# Patient Record
Sex: Female | Born: 1960 | Race: White | Hispanic: No | Marital: Married | Smoking: Never smoker
Health system: Southern US, Community
[De-identification: ages and names within clinical notes are randomized; demographics above are authoritative.]

## PROBLEM LIST (undated history)

## (undated) DIAGNOSIS — E039 Hypothyroidism, unspecified: Secondary | ICD-10-CM

## (undated) DIAGNOSIS — Z87442 Personal history of urinary calculi: Secondary | ICD-10-CM

## (undated) DIAGNOSIS — D649 Anemia, unspecified: Secondary | ICD-10-CM

## (undated) DIAGNOSIS — T8859XA Other complications of anesthesia, initial encounter: Secondary | ICD-10-CM

## (undated) DIAGNOSIS — I499 Cardiac arrhythmia, unspecified: Secondary | ICD-10-CM

## (undated) DIAGNOSIS — I10 Essential (primary) hypertension: Secondary | ICD-10-CM

## (undated) HISTORY — PX: CHOLECYSTECTOMY: SHX55

## (undated) HISTORY — PX: ABDOMINAL HYSTERECTOMY: SHX81

## (undated) HISTORY — PX: DILATION AND CURETTAGE OF UTERUS: SHX78

## (undated) HISTORY — PX: APPENDECTOMY: SHX54

---

## 2004-11-14 ENCOUNTER — Ambulatory Visit: Payer: Self-pay | Admitting: Family Medicine

## 2005-12-30 ENCOUNTER — Ambulatory Visit: Payer: Self-pay | Admitting: Obstetrics and Gynecology

## 2008-06-24 ENCOUNTER — Ambulatory Visit: Payer: Self-pay | Admitting: Family Medicine

## 2008-06-29 ENCOUNTER — Ambulatory Visit: Payer: Self-pay | Admitting: Family Medicine

## 2008-07-01 ENCOUNTER — Inpatient Hospital Stay: Payer: Self-pay | Admitting: Surgery

## 2011-05-30 ENCOUNTER — Ambulatory Visit: Payer: Self-pay | Admitting: General Practice

## 2013-08-10 ENCOUNTER — Ambulatory Visit: Payer: Self-pay | Admitting: Internal Medicine

## 2015-04-05 DIAGNOSIS — G8929 Other chronic pain: Secondary | ICD-10-CM | POA: Insufficient documentation

## 2015-04-05 DIAGNOSIS — M542 Cervicalgia: Secondary | ICD-10-CM | POA: Insufficient documentation

## 2015-04-05 DIAGNOSIS — E559 Vitamin D deficiency, unspecified: Secondary | ICD-10-CM | POA: Insufficient documentation

## 2015-04-05 DIAGNOSIS — H9193 Unspecified hearing loss, bilateral: Secondary | ICD-10-CM | POA: Insufficient documentation

## 2015-04-05 DIAGNOSIS — E039 Hypothyroidism, unspecified: Secondary | ICD-10-CM | POA: Insufficient documentation

## 2016-02-07 ENCOUNTER — Other Ambulatory Visit: Payer: Self-pay | Admitting: Internal Medicine

## 2016-02-07 DIAGNOSIS — Z1239 Encounter for other screening for malignant neoplasm of breast: Secondary | ICD-10-CM

## 2016-02-27 ENCOUNTER — Ambulatory Visit: Payer: Self-pay

## 2016-03-08 ENCOUNTER — Ambulatory Visit
Admission: RE | Admit: 2016-03-08 | Discharge: 2016-03-08 | Disposition: A | Discharge status: Home / Self Care | Payer: Managed Care, Other (non HMO) | Source: Ambulatory Visit | Attending: Internal Medicine | Admitting: Internal Medicine

## 2016-03-08 DIAGNOSIS — Z1239 Encounter for other screening for malignant neoplasm of breast: Secondary | ICD-10-CM

## 2016-03-08 DIAGNOSIS — Z1231 Encounter for screening mammogram for malignant neoplasm of breast: Secondary | ICD-10-CM | POA: Insufficient documentation

## 2016-04-23 ENCOUNTER — Encounter: Payer: Self-pay | Admitting: Physician Assistant

## 2016-04-23 ENCOUNTER — Ambulatory Visit: Payer: Self-pay | Admitting: Physician Assistant

## 2016-04-23 VITALS — BP 120/70 | HR 83 | Temp 98.3°F

## 2016-04-23 DIAGNOSIS — B029 Zoster without complications: Secondary | ICD-10-CM

## 2016-04-23 MED ORDER — HYDROCODONE-ACETAMINOPHEN 5-325 MG PO TABS: 1.0000 | ORAL_TABLET | Freq: Four times a day (QID) | ORAL | Status: DC | PRN

## 2016-04-23 MED ORDER — METHYLPREDNISOLONE 4 MG PO TBPK: ORAL_TABLET | ORAL | Status: DC

## 2016-04-23 MED ORDER — FAMCICLOVIR 500 MG PO TABS: 500.0000 mg | ORAL_TABLET | Freq: Three times a day (TID) | ORAL | Status: DC

## 2016-04-23 NOTE — Progress Notes (Signed)
S: c/o pain under left breast and down shoulder, has red area, has happened before and red area turns into blisters after a day or 2, no fever/chills/injury, area is burning and painful  O: vitals wnl, nad, skin with reddened area under left axilla, area tender to palp, no vesicles noted, n/v intact  A: herpes zoster  P: famvir, medrol dose pack, vicodin 5/325 #20 nr

## 2016-08-22 DIAGNOSIS — E7800 Pure hypercholesterolemia, unspecified: Secondary | ICD-10-CM | POA: Insufficient documentation

## 2017-09-11 DIAGNOSIS — F4323 Adjustment disorder with mixed anxiety and depressed mood: Secondary | ICD-10-CM | POA: Insufficient documentation

## 2017-09-11 DIAGNOSIS — D7589 Other specified diseases of blood and blood-forming organs: Secondary | ICD-10-CM | POA: Insufficient documentation

## 2017-10-06 ENCOUNTER — Encounter: Payer: Self-pay | Admitting: Physician Assistant

## 2017-10-06 ENCOUNTER — Ambulatory Visit: Payer: Self-pay | Admitting: Physician Assistant

## 2017-10-06 VITALS — BP 118/75 | HR 77 | Temp 98.5°F | Resp 16

## 2017-10-06 DIAGNOSIS — H1013 Acute atopic conjunctivitis, bilateral: Secondary | ICD-10-CM

## 2017-10-06 DIAGNOSIS — J309 Allergic rhinitis, unspecified: Principal | ICD-10-CM

## 2017-10-06 MED ORDER — FEXOFENADINE-PSEUDOEPHED ER 60-120 MG PO TB12: 1.0000 | ORAL_TABLET | Freq: Two times a day (BID) | ORAL | 0 refills | Status: DC

## 2017-10-06 MED ORDER — FLUTICASONE PROPIONATE 50 MCG/ACT NA SUSP: 2.0000 | Freq: Every day | NASAL | 6 refills | Status: DC

## 2017-10-06 MED ORDER — NAPHAZOLINE HCL 0.1 % OP SOLN: 1.0000 [drp] | Freq: Four times a day (QID) | OPHTHALMIC | 0 refills | Status: DC | PRN

## 2017-10-06 NOTE — Progress Notes (Signed)
   Subjective:eye swelling    Patient ID: Kathryn Powers, female    DOB: 1961/08/29, [age] y.o.   MRN: [id number]  HPI Patient c/o bilateral eye burning and edema for 3 days. Denies discharge. States Photophobia today.  Also c/o nasal congestion. Denies fever/chill, or N/V/D.   Review of Systems Hypertension and Hypothroidism    Objective:   Physical Exam Photophobic with erythematous conjunctiva. Bilateral eyelid edema. Edematous bilateral nasal turbinates. Neck supp;e w/o adenopathy. Lungs CTA and Heart RRR.       Assessment & Plan:Allergic conjunctivia  Naphcon-A, Allergra-D, and Flonase as directed. Follow up one week if no improvement.

## 2017-10-09 ENCOUNTER — Telehealth: Payer: Self-pay | Admitting: Emergency Medicine

## 2017-10-09 NOTE — Telephone Encounter (Signed)
Is she saying her eye is worse or her cold is worse? If its her eye she needs drops, if its the cold sx she needs oral antibiotic

## 2017-10-09 NOTE — Telephone Encounter (Signed)
Patient called and expressed that her eye isn't getting any better.  Was told to call if no better and to get an antibiotic called in to [facility].

## 2017-12-04 ENCOUNTER — Emergency Department: Payer: Managed Care, Other (non HMO)

## 2017-12-04 ENCOUNTER — Ambulatory Visit: Payer: Self-pay

## 2017-12-04 ENCOUNTER — Encounter: Payer: Self-pay | Admitting: Emergency Medicine

## 2017-12-04 ENCOUNTER — Other Ambulatory Visit: Payer: Self-pay

## 2017-12-04 ENCOUNTER — Emergency Department
Admission: EM | Admit: 2017-12-04 | Discharge: 2017-12-04 | Disposition: A | Discharge status: Home / Self Care | Payer: Managed Care, Other (non HMO) | Attending: Emergency Medicine | Admitting: Emergency Medicine

## 2017-12-04 DIAGNOSIS — Y999 Unspecified external cause status: Secondary | ICD-10-CM | POA: Diagnosis not present

## 2017-12-04 DIAGNOSIS — R51 Headache: Secondary | ICD-10-CM | POA: Insufficient documentation

## 2017-12-04 DIAGNOSIS — Y939 Activity, unspecified: Secondary | ICD-10-CM | POA: Diagnosis not present

## 2017-12-04 DIAGNOSIS — S0083XA Contusion of other part of head, initial encounter: Secondary | ICD-10-CM | POA: Diagnosis not present

## 2017-12-04 DIAGNOSIS — Z79899 Other long term (current) drug therapy: Secondary | ICD-10-CM | POA: Insufficient documentation

## 2017-12-04 DIAGNOSIS — I10 Essential (primary) hypertension: Secondary | ICD-10-CM | POA: Diagnosis not present

## 2017-12-04 DIAGNOSIS — E039 Hypothyroidism, unspecified: Secondary | ICD-10-CM | POA: Diagnosis not present

## 2017-12-04 DIAGNOSIS — Y929 Unspecified place or not applicable: Secondary | ICD-10-CM | POA: Diagnosis not present

## 2017-12-04 DIAGNOSIS — W01190A Fall on same level from slipping, tripping and stumbling with subsequent striking against furniture, initial encounter: Secondary | ICD-10-CM | POA: Diagnosis not present

## 2017-12-04 DIAGNOSIS — W19XXXA Unspecified fall, initial encounter: Secondary | ICD-10-CM

## 2017-12-04 HISTORY — DX: Hypothyroidism, unspecified: E03.9

## 2017-12-04 HISTORY — DX: Essential (primary) hypertension: I10

## 2017-12-04 MED ORDER — ONDANSETRON 4 MG PO TBDP
4.0000 mg | ORAL_TABLET | Freq: Once | ORAL | Status: AC
  Administered 2017-12-04: 4 mg via ORAL
  Filled 2017-12-04: qty 1

## 2017-12-04 MED ORDER — CYCLOBENZAPRINE HCL 5 MG PO TABS: 5.0000 mg | ORAL_TABLET | Freq: Three times a day (TID) | ORAL | 0 refills | Status: AC | PRN

## 2017-12-04 MED ORDER — KETOROLAC TROMETHAMINE 30 MG/ML IJ SOLN
30.0000 mg | Freq: Once | INTRAMUSCULAR | Status: AC
  Administered 2017-12-04: 30 mg via INTRAMUSCULAR
  Filled 2017-12-04: qty 1

## 2017-12-04 MED ORDER — KETOROLAC TROMETHAMINE 10 MG PO TABS: 10.0000 mg | ORAL_TABLET | Freq: Four times a day (QID) | ORAL | 0 refills | Status: AC | PRN

## 2017-12-04 NOTE — ED Provider Notes (Signed)
[facility] Emergency Department Provider Note  ____________________________________________  Time seen: Approximately 3:08 PM  I have reviewed the triage vital signs and the nursing notes.   HISTORY  Chief Complaint Fall    HPI Kathryn Powers is a 56 y.o. female presents to the emergency department after a fall 2 days ago.  Patient reports that she experienced momentary loss of consciousness.  Patient reports blurry vision from right eye since incident.  She is also noticed significant right inferior orbit bruising as well as pain with right extraocular eye muscle movement.  Patient has been ambulating without difficulty and denies neck pain.  She has had nausea but no vomiting.  She denies weakness, radiculopathy or changes in sensation of the lower extremities.  She reports feeling "foggy".  Patient has been has noticed no changes in speech or mechanical movement.  No alleviating measures of been attempted.   Past Medical History:  Diagnosis Date  . Hypertension   . Hypothyroidism     There are no active problems to display for this patient.   History reviewed. No pertinent surgical history.  Prior to Admission medications   Medication Sig Start Date End Date Taking? Authorizing Provider  atenolol (TENORMIN) 50 MG tablet Take by mouth. 01/30/16   [provider]  cyclobenzaprine (FLEXERIL) 5 MG tablet Take 1 tablet (5 mg total) by mouth 3 (three) times daily as needed for up to 3 days for muscle spasms. 12/04/17 12/07/17  Orvil FeilWoods, Jaclyn M, PA-C  fexofenadine-pseudoephedrine (ALLEGRA-D) 60-120 MG 12 hr tablet Take 1 tablet by mouth 2 (two) times daily. 10/06/17   Joni ReiningSmith, Ronald K, PA-C  fluticasone (FLONASE) 50 MCG/ACT nasal spray Place 2 sprays into both nostrils daily. 10/06/17   Joni ReiningSmith, Ronald K, PA-C  ketorolac (TORADOL) 10 MG tablet Take 1 tablet (10 mg total) by mouth every 6 (six) hours as needed for up to 5 days. 12/04/17 12/09/17  Orvil FeilWoods,  Jaclyn M, PA-C  levothyroxine (SYNTHROID, LEVOTHROID) 100 MCG tablet Take by mouth. 01/30/16   [provider]  naphazoline (NAPHCON) 0.1 % ophthalmic solution Place 1 drop into both eyes 4 (four) times daily as needed for eye irritation. 10/06/17   Joni ReiningSmith, Ronald K, PA-C  pravastatin (PRAVACHOL) 20 MG tablet Take by mouth. 11/15/16 11/15/17  [provider]  Vitamin D, Ergocalciferol, (DRISDOL) 50000 units CAPS capsule TAKE ONE CAPSULE BY MOUTH ONCE WEEKLY 09/02/17   [provider]    Allergies Codeine  Family History  Problem Relation Age of Onset  . Breast cancer Neg Hx     Social History Social History   Tobacco Use  . Smoking status: Never Smoker  . Smokeless tobacco: Never Used  Substance Use Topics  . Alcohol use: Yes    Alcohol/week: 0.0 oz    Comment: occasional  . Drug use: No     Review of Systems  Constitutional: No fever/chills Eyes: Patient has right inferior orbit bruising.   ENT: No upper respiratory complaints. Cardiovascular: no chest pain. Respiratory: no cough. No SOB. Gastrointestinal: Patient has nausea. Musculoskeletal: Negative for musculoskeletal pain. Skin: Negative for rash, abrasions, lacerations, ecchymosis. Neurological: Patient has headache, no focal weakness or numbness.   ____________________________________________   PHYSICAL EXAM:  VITAL SIGNS: ED Triage Vitals  Enc Vitals Group     BP 12/04/17 1445 128/73     Pulse Rate 12/04/17 1445 83     Resp 12/04/17 1445 13     Temp 12/04/17 1445 98.6 F (37 C)  Temp Source 12/04/17 1445 Oral     SpO2 12/04/17 1445 97 %     Weight 12/04/17 1445 118 lb (53.5 kg)     Height 12/04/17 1445 4' 10.5" (1.486 m)     Head Circumference --      Peak Flow --      Pain Score 12/04/17 1455 8     Pain Loc --      Pain Edu? --      Excl. in GC? --      Constitutional: Alert and oriented. Well appearing and in no acute distress. Eyes: Conjunctivae are normal. PERRL.  EOMI. patient has pain elicited with right extraocular eye muscle testing.  Mild right nystagmus is also appreciated.  Patient has a stigmatism of the left eye at baseline. Head: Atraumatic. ENT:      Ears: TMs are pearly bilaterally without evidence of bloody effusion.      Nose: No congestion/rhinnorhea.      Mouth/Throat: Mucous membranes are moist.  Uvula is midline. Neck: No stridor.  No tenderness was elicited with palpation of the cervical spine. Cardiovascular: Normal rate, regular rhythm. Normal S1 and S2.  Good peripheral circulation. Respiratory: Normal respiratory effort without tachypnea or retractions. Lungs CTAB. Good air entry to the bases with no decreased or absent breath sounds. Gastrointestinal: Bowel sounds 4 quadrants. Soft and nontender to palpation. No guarding or rigidity. No palpable masses. No distention. No CVA tenderness. Musculoskeletal: Full range of motion to all extremities. No gross deformities appreciated. Neurologic:  Normal speech and language. No gross focal neurologic deficits are appreciated.  Skin:  Skin is warm, dry and intact. No rash noted. Psychiatric: Mood and affect are normal. Speech and behavior are normal. Patient exhibits appropriate insight and judgement.   ____________________________________________   LABS (all labs ordered are listed, but only abnormal results are displayed)  Labs Reviewed - No data to display ____________________________________________  EKG   ____________________________________________  RADIOLOGY Geraldo PitterI, Jaclyn M Woods, personally viewed and evaluated these images as part of my medical decision making, as well as reviewing the written report by the radiologist.    Ct Head Wo Contrast  Result Date: 12/04/2017 CLINICAL DATA:  Status post fall. States she tripped and hit a table or chair 2 days ago bruising noted to right eye. Continues to have headache. EXAM: CT HEAD WITHOUT CONTRAST CT MAXILLOFACIAL WITHOUT  CONTRAST TECHNIQUE: Multidetector CT imaging of the head and maxillofacial structures were performed using the standard protocol without intravenous contrast. Multiplanar CT image reconstructions of the maxillofacial structures were also generated. COMPARISON:  None. FINDINGS: CT HEAD FINDINGS Brain: No evidence of acute infarction, hemorrhage, hydrocephalus, extra-axial collection or mass lesion/mass effect. Vascular: No hyperdense vessel or unexpected calcification. Skull: No osseous abnormality. Sinuses/Orbits: Visualized paranasal sinuses are clear. Visualized mastoid sinuses are clear. Visualized orbits demonstrate no focal abnormality. Other: None CT MAXILLOFACIAL FINDINGS Osseous: No fracture or mandibular dislocation. No destructive process. Orbits: Negative. No traumatic or inflammatory finding. Sinuses: Clear. Soft tissues: Small right supraorbital hematoma. IMPRESSION: 1. No acute intracranial pathology. 2. No acute osseous injury the maxillofacial bones. 3. Right supraorbital hematoma. Electronically Signed   By: Elige KoHetal  Patel   On: 12/04/2017 16:17   Ct Maxillofacial Wo Contrast  Result Date: 12/04/2017 CLINICAL DATA:  Status post fall. States she tripped and hit a table or chair 2 days ago bruising noted to right eye. Continues to have headache. EXAM: CT HEAD WITHOUT CONTRAST CT MAXILLOFACIAL WITHOUT CONTRAST TECHNIQUE: Multidetector CT imaging of  the head and maxillofacial structures were performed using the standard protocol without intravenous contrast. Multiplanar CT image reconstructions of the maxillofacial structures were also generated. COMPARISON:  None. FINDINGS: CT HEAD FINDINGS Brain: No evidence of acute infarction, hemorrhage, hydrocephalus, extra-axial collection or mass lesion/mass effect. Vascular: No hyperdense vessel or unexpected calcification. Skull: No osseous abnormality. Sinuses/Orbits: Visualized paranasal sinuses are clear. Visualized mastoid sinuses are clear. Visualized  orbits demonstrate no focal abnormality. Other: None CT MAXILLOFACIAL FINDINGS Osseous: No fracture or mandibular dislocation. No destructive process. Orbits: Negative. No traumatic or inflammatory finding. Sinuses: Clear. Soft tissues: Small right supraorbital hematoma. IMPRESSION: 1. No acute intracranial pathology. 2. No acute osseous injury the maxillofacial bones. 3. Right supraorbital hematoma. Electronically Signed   By: Elige Ko   On: 12/04/2017 16:17    ____________________________________________    PROCEDURES  Procedure(s) performed:    Procedures    Medications  ondansetron (ZOFRAN-ODT) disintegrating tablet 4 mg (4 mg Oral Given 12/04/17 1514)  ketorolac (TORADOL) 30 MG/ML injection 30 mg (30 mg Intramuscular Given 12/04/17 1638)     ____________________________________________   INITIAL IMPRESSION / ASSESSMENT AND PLAN / ED COURSE  Pertinent labs & imaging results that were available during my care of the patient were reviewed by me and considered in my medical decision making (see chart for details).  Review of the [place] CSRS was performed in accordance of the [facility] prior to dispensing any controlled drugs.     Assessment and Plan: Patient presents to the emergency department with ecchymosis of the right inferior orbit, blurry vision and nausea.  CT head and CT maxillofacial were warranted.  Original differential diagnosis included blowout fracture versus extraocular eye muscle entrapment versus intracranial hemorrhage.  Workup conducted in the emergency department was reassuring and revealed no acute abnormality.  Neurologic exam was reassuring as well.  Patient was discharged with Toradol and Flexeril.  An injection of Toradol was given in the emergency department after CT scans were reviewed.  All patient questions were answered.   ____________________________________________  FINAL CLINICAL IMPRESSION(S) / ED DIAGNOSES  Final diagnoses:  Fall, initial  encounter  Contusion of face, initial encounter      NEW MEDICATIONS STARTED DURING THIS VISIT:  ED Discharge Orders        Ordered    ketorolac (TORADOL) 10 MG tablet  Every 6 hours PRN     12/04/17 1629    cyclobenzaprine (FLEXERIL) 5 MG tablet  3 times daily PRN     12/04/17 1629          This chart was dictated using voice recognition software/Dragon. Despite best efforts to proofread, errors can occur which can change the meaning. Any change was purely unintentional.    Orvil Feil, PA-C 12/04/17 1733    Dionne Bucy, MD 12/04/17 [phone]

## 2017-12-04 NOTE — ED Triage Notes (Signed)
Presents s/p fall  States she tripped and hit a table or chair 2 days ago  bruising noted to right eye   conts to have headache

## 2018-04-14 ENCOUNTER — Ambulatory Visit: Payer: Self-pay | Admitting: Physician Assistant

## 2018-04-14 VITALS — BP 142/93 | HR 75 | Resp 16 | Ht 60.0 in | Wt 130.0 lb

## 2018-04-14 DIAGNOSIS — Z008 Encounter for other general examination: Secondary | ICD-10-CM

## 2018-04-14 DIAGNOSIS — Z0189 Encounter for other specified special examinations: Principal | ICD-10-CM

## 2018-04-14 LAB — GLUCOSE, POCT (MANUAL RESULT ENTRY): POC GLUCOSE: 104 mg/dL — AB (ref 70–99)

## 2018-04-14 NOTE — Progress Notes (Signed)
   Subjective:    Patient ID: Kathryn Powers, female    DOB: 1960/12/24, 57 y.o.   MRN: [id number]  HPI    Review of Systems     Objective:   Physical Exam        Assessment & Plan:

## 2018-04-14 NOTE — Progress Notes (Signed)
Patient ID: Kathryn Powers, female   DOB: 09/23/1961, 57 y.o.   MRN: [id number]

## 2018-04-15 LAB — LIPID PANEL
CHOL/HDL RATIO: 2.7 ratio (ref 0.0–4.4)
CHOLESTEROL TOTAL: 262 mg/dL — AB (ref 100–199)
HDL: 98 mg/dL (ref >39)
LDL CALC: 130 mg/dL — AB (ref 0–99)
Triglycerides: 168 mg/dL — ABNORMAL HIGH (ref 0–149)
VLDL Cholesterol Cal: 34 mg/dL (ref 5–40)

## 2018-04-17 NOTE — Progress Notes (Signed)
Kathryn Powers, Will you call the patient and inform them that their lipid panel came back?  I am not sending her a message through Mychart about the results because I thought this would create confusion since she saw Lynnell Grain, PA on Tuesday instead of me.  This was ordered under my name since you were unable to order it under [name]'s name on Tuesday. The total cholesterol is elevated at 262, normal values are between 100 and 199.  The triglyceride level is elevated at 168, normal values are between 0 and 149.  The VLDL cholesterol is normal at 34, normal values are between 5 and 40. The HDL cholesterol ("good cholesterol") is normal at 98, normal values are above 39.  The LDL cholesterol ("bad cholesterol") is elevated at 130, normal values are below 99.  The cholesterol/HDL ratio is normal at 2.7, normal values are between 0 and 4.4 for women or 0 and 5 from men.  These abnormal values increase their risk for cardiovascular disease.  Please advise the patient to discuss this with their primary care provider at their next visit.  I saw that [name] documented that she already spoke with the patient regarding her elevated fasting blood sugar.

## 2018-05-04 ENCOUNTER — Other Ambulatory Visit: Payer: Self-pay

## 2018-05-04 ENCOUNTER — Emergency Department
Admission: EM | Admit: 2018-05-04 | Discharge: 2018-05-04 | Disposition: A | Discharge status: Home / Self Care | Payer: Managed Care, Other (non HMO) | Attending: Emergency Medicine | Admitting: Emergency Medicine

## 2018-05-04 ENCOUNTER — Emergency Department: Payer: Managed Care, Other (non HMO)

## 2018-05-04 ENCOUNTER — Encounter: Payer: Self-pay | Admitting: Emergency Medicine

## 2018-05-04 DIAGNOSIS — Y999 Unspecified external cause status: Secondary | ICD-10-CM | POA: Diagnosis not present

## 2018-05-04 DIAGNOSIS — Y939 Activity, unspecified: Secondary | ICD-10-CM | POA: Insufficient documentation

## 2018-05-04 DIAGNOSIS — S2231XA Fracture of one rib, right side, initial encounter for closed fracture: Secondary | ICD-10-CM | POA: Insufficient documentation

## 2018-05-04 DIAGNOSIS — Z79899 Other long term (current) drug therapy: Secondary | ICD-10-CM | POA: Diagnosis not present

## 2018-05-04 DIAGNOSIS — I10 Essential (primary) hypertension: Secondary | ICD-10-CM | POA: Diagnosis not present

## 2018-05-04 DIAGNOSIS — Y929 Unspecified place or not applicable: Secondary | ICD-10-CM | POA: Insufficient documentation

## 2018-05-04 DIAGNOSIS — E039 Hypothyroidism, unspecified: Secondary | ICD-10-CM | POA: Diagnosis not present

## 2018-05-04 DIAGNOSIS — S20301A Unspecified superficial injuries of right front wall of thorax, initial encounter: Secondary | ICD-10-CM | POA: Diagnosis present

## 2018-05-04 DIAGNOSIS — W19XXXA Unspecified fall, initial encounter: Secondary | ICD-10-CM | POA: Insufficient documentation

## 2018-05-04 MED ORDER — MELOXICAM 15 MG PO TABS: 15.0000 mg | ORAL_TABLET | Freq: Every day | ORAL | 0 refills | Status: DC

## 2018-05-04 MED ORDER — HYDROCODONE-ACETAMINOPHEN 5-325 MG PO TABS: 1.0000 | ORAL_TABLET | ORAL | 0 refills | Status: DC | PRN

## 2018-05-04 MED ORDER — MELOXICAM 7.5 MG PO TABS
15.0000 mg | ORAL_TABLET | Freq: Once | ORAL | Status: AC
  Administered 2018-05-04: 15 mg via ORAL
  Filled 2018-05-04: qty 2

## 2018-05-04 MED ORDER — OXYCODONE-ACETAMINOPHEN 5-325 MG PO TABS
1.0000 | ORAL_TABLET | Freq: Once | ORAL | Status: AC
  Administered 2018-05-04: 1 via ORAL
  Filled 2018-05-04: qty 1

## 2018-05-04 NOTE — ED Triage Notes (Signed)
Mechanical fall 3 days ago. Pain R ribs.

## 2018-05-04 NOTE — ED Provider Notes (Signed)
[facility] Emergency Department Provider Note  ____________________________________________  Time seen: Approximately 3:22 PM  I have reviewed the triage vital signs and the nursing notes.   HISTORY  Chief Complaint Fall    HPI Kathryn Powers is a 57 y.o. female who presents the emergency department complaining of right rib pain and shortness of breath.  Patient reports that she had an unprotected fall 3 days ago.  Patient reports that she slipped, falling directly on her right side.  Patient was unable to break her fall.  Patient did not hit her head or lose consciousness during this event.  Initially, patient had pain to both her right hip and her right rib cage.  Patient felt like it was "just a bruise" and did not seek medical care.  She reports that the hip pain has improved and she is ambulatory without difficulty.  However, her right rib pain has increased.  Today, patient bent over, felt a sharp pain to the right lateral ribs and then began feeling shortness of breath.  Patient does have a history of sternal fracture in the remote past.  Patient denies any headache, visual changes, substernal chest pain, abdominal pain, nausea or vomiting.  Past Medical History:  Diagnosis Date  . Hypertension   . Hypothyroidism     There are no active problems to display for this patient.   History reviewed. No pertinent surgical history.  Prior to Admission medications   Medication Sig Start Date End Date Taking? Authorizing Provider  atenolol (TENORMIN) 50 MG tablet Take by mouth. 01/30/16   [provider]  HYDROcodone-acetaminophen (NORCO/VICODIN) 5-325 MG tablet Take 1 tablet by mouth every 4 (four) hours as needed for moderate pain. 05/04/18   Uriah Philipson, Delorise Royals, PA-C  levothyroxine (SYNTHROID, LEVOTHROID) 100 MCG tablet Take by mouth. 01/30/16   [provider]  meloxicam (MOBIC) 15 MG tablet Take 1 tablet (15 mg total) by mouth daily.  05/04/18   Elise Gladden, Delorise Royals, PA-C  PARoxetine (PAXIL) 20 MG tablet TAKE ONE TABLET BY MOUTH DAILY 01/12/18   [provider]  pravastatin (PRAVACHOL) 20 MG tablet Take by mouth. 11/15/16 11/15/17  [provider]    Allergies Codeine  Family History  Problem Relation Age of Onset  . Breast cancer Neg Hx     Social History Social History   Tobacco Use  . Smoking status: Never Smoker  . Smokeless tobacco: Never Used  Substance Use Topics  . Alcohol use: Yes    Alcohol/week: 0.0 oz    Comment: occasional  . Drug use: No     Review of Systems  Constitutional: No fever/chills Eyes: No visual changes ENT: No upper respiratory complaints. Cardiovascular: no chest pain. Respiratory: no cough.  Positive SOB. Gastrointestinal: No abdominal pain.  No nausea, no vomiting.   Musculoskeletal: Positive for right lateral rib pain Skin: Negative for rash, abrasions, lacerations, ecchymosis. Neurological: Negative for headaches, focal weakness or numbness. 10-point ROS otherwise negative.  ____________________________________________   PHYSICAL EXAM:  VITAL SIGNS: ED Triage Vitals  Enc Vitals Group     BP 05/04/18 1512 128/82     Pulse Rate 05/04/18 1512 77     Resp 05/04/18 1512 18     Temp 05/04/18 1512 98.2 F (36.8 C)     Temp Source 05/04/18 1512 Oral     SpO2 05/04/18 1512 99 %     Weight 05/04/18 1514 120 lb (54.4 kg)     Height 05/04/18 1514 5' (1.524 m)  Head Circumference --      Peak Flow --      Pain Score 05/04/18 1514 9     Pain Loc --      Pain Edu? --      Excl. in GC? --      Constitutional: Alert and oriented. Well appearing and in no acute distress. Eyes: Conjunctivae are normal. PERRL. EOMI. Head: Atraumatic. Neck: No stridor.  No cervical spine tenderness to palpation  Cardiovascular: Normal rate, regular rhythm. Normal S1 and S2.  Good peripheral circulation. Respiratory: Normal respiratory effort without tachypnea or  retractions. Lungs CTAB. Good air entry to the bases with no decreased or absent breath sounds. Gastrointestinal: Bowel sounds 4 quadrants. Soft and nontender to palpation. No guarding or rigidity. No palpable masses. No distention. No CVA tenderness. Musculoskeletal: Full range of motion to all extremities. No gross deformities appreciated.  No appreciable abnormality to the external rib cage on inspection.  No paradoxical chest wall movement.  Patient is extremely tender to palpation over the right lateral and anterolateral rib cage.  Patient is most tender palpation of her ribs 7 through 12.  No palpable abnormality.  No crepitus.  No decreased or changes in underlying breath sounds from unaffected side. Neurologic:  Normal speech and language. No gross focal neurologic deficits are appreciated.  Skin:  Skin is warm, dry and intact. No rash noted. Psychiatric: Mood and affect are normal. Speech and behavior are normal. Patient exhibits appropriate insight and judgement.   ____________________________________________   LABS (all labs ordered are listed, but only abnormal results are displayed)  Labs Reviewed - No data to display ____________________________________________  EKG   ____________________________________________  RADIOLOGY Festus Barren Woodard Perrell, personally viewed and evaluated these images (plain radiographs) as part of my medical decision making, as well as reviewing the written report by the radiologist.  Dg Ribs Unilateral W/chest Right  Result Date: 05/04/2018 CLINICAL DATA:  Recent fall with right chest wall pain. EXAM: RIGHT RIBS AND CHEST - 3+ VIEW COMPARISON:  None. FINDINGS: Normal heart size. Normal mediastinal contour. No pneumothorax. No pleural effusion. Lungs appear clear, with no acute consolidative airspace disease and no pulmonary edema. The area of symptomatic concern as indicated by the patient in the lower right chest wall was denoted with a metallic  skin BB by the technologist. Acute nondisplaced anterior right seventh rib fracture. No additional fractures. No suspicious focal osseous lesions. IMPRESSION: 1. Acute nondisplaced anterior right seventh rib fracture. 2. No active cardiopulmonary disease.  No pneumothorax. Electronically Signed   By: Delbert Phenix M.D.   On: 05/04/2018 15:49    ____________________________________________    PROCEDURES  Procedure(s) performed:    Procedures    Medications  oxyCODONE-acetaminophen (PERCOCET/ROXICET) 5-325 MG per tablet 1 tablet (1 tablet Oral Given 05/04/18 1629)  meloxicam (MOBIC) tablet 15 mg (15 mg Oral Given 05/04/18 1629)     ____________________________________________   INITIAL IMPRESSION / ASSESSMENT AND PLAN / ED COURSE  Pertinent labs & imaging results that were available during my care of the patient were reviewed by me and considered in my medical decision making (see chart for details).  Review of the [place] CSRS was performed in accordance of the [facility] prior to dispensing any controlled drugs.     Patient's diagnosis is consistent with refracture.  Patient presents emergency department with pain to the right rib cage and shortness of breath.  On exam, no palpable findings, no changes in underlying breath sounds.  X-ray reveals seventh  rib fracture to the right side.  No pneumothorax or other cardiopulmonary abnormality identified on x-ray.. Patient will be discharged home with prescriptions for meloxicam and limited prescription of Vicodin for pain relief.. Patient is to follow up with primary care as needed or otherwise directed. Patient is given ED precautions to return to the ED for any worsening or new symptoms.     ____________________________________________  FINAL CLINICAL IMPRESSION(S) / ED DIAGNOSES  Final diagnoses:  Closed fracture of one rib of right side, initial encounter      NEW MEDICATIONS STARTED DURING THIS VISIT:  ED Discharge Orders         Ordered    HYDROcodone-acetaminophen (NORCO/VICODIN) 5-325 MG tablet  Every 4 hours PRN     05/04/18 1621    meloxicam (MOBIC) 15 MG tablet  Daily     05/04/18 1621          This chart was dictated using voice recognition software/Dragon. Despite best efforts to proofread, errors can occur which can change the meaning. Any change was purely unintentional.    Racheal Patches, PA-C 05/04/18 1639    Don Perking, [place], MD 05/07/18 [phone]

## 2018-07-30 ENCOUNTER — Ambulatory Visit: Payer: Self-pay | Admitting: Family Medicine

## 2018-07-30 VITALS — BP 123/83 | HR 70 | Temp 98.4°F | Resp 16

## 2018-07-30 DIAGNOSIS — M791 Myalgia, unspecified site: Secondary | ICD-10-CM

## 2018-07-30 DIAGNOSIS — R519 Headache, unspecified: Secondary | ICD-10-CM

## 2018-07-30 DIAGNOSIS — W57XXXA Bitten or stung by nonvenomous insect and other nonvenomous arthropods, initial encounter: Secondary | ICD-10-CM

## 2018-07-30 DIAGNOSIS — R5383 Other fatigue: Secondary | ICD-10-CM

## 2018-07-30 DIAGNOSIS — R51 Headache: Secondary | ICD-10-CM

## 2018-07-30 LAB — POCT INFLUENZA A/B
INFLUENZA A, POC: NEGATIVE
Influenza B, POC: NEGATIVE

## 2018-07-30 MED ORDER — DOXYCYCLINE HYCLATE 100 MG PO TABS: 100.0000 mg | ORAL_TABLET | Freq: Two times a day (BID) | ORAL | 0 refills | Status: AC

## 2018-07-30 NOTE — Progress Notes (Signed)
Subjective: Headache, body aches, tick bite  Kathryn Powers is a 57 y.o. female who presents for evaluation mild headache, body aches, fatigue, low-grade fever, sore throat, arthralgias (bilateral knees and elbows especially), and nasal congestion with green drainage for 6 days.   Patient reports sore throat and congestion have been improving but that the remainder of her symptoms have remained the same since the onset.  Reports feeling "under the weather" last week with fatigue being her predominant symptom but that she just though she was "run down".  Patient thought it might be an upper respiratory infection until she found a tick attached to her right posterior neck this morning while in the shower.  Patient describes the tick as black and "plump".  Unsure how long the tick has been present for or the type of tick.  Patient reports being outdoors multiple times in the last couple [place]  Patient denies any previous history of tickborne illness to her knowledge. Patient also endorses diarrhea that began last night, describes as mild, denies blood/mucus/foul odor/steatorrhea or any other abnormal characteristics.  Reports this is not totally abnormal for her, as she has irregular bowel patterns chronically.  Reports low-grade fever intermittently for the last 6 days, T-max 99.0. Denies any other lesions or rash except for a few small bumps under her right breast that she noticed yesterday that she describes as pruritic.  Describes as stable in appearance.  Denies pain or tenderness to palpation.  Denies drainage or bleeding. Patient has not had contacts with similar symptoms.  Denies recent travel.    Denies abdominal pain, neurologic or cardiac symptoms, irritability, nausea, vomiting, SOB, wheezing, cough, chest or back pain, ear pain, difficulty swallowing, confusion, anosmia/hyposmia, dental pain, facial pressure/unilateral facial pain, pain exacerbated by  bending over, severe symptoms, or initial improvement and then worsening of symptoms. Denies any other symptoms or complaints.  Treatment attempted at home: Ibuprofen, acetaminophen, and TheraFlu.  Review of Systems Pertinent items noted in HPI and remainder of comprehensive ROS otherwise negative.    Objective:   BP 123/83 (BP Location: Right Arm, Patient Position: Sitting, Cuff Size: Normal)   Pulse 70   Temp 98.4 F (36.9 C) (Oral)   Resp 16   SpO2 98%  General: alert, cooperative, appears stated age and no distress.  Nontoxic appearance. Head: Normocephalic, without obvious abnormality, atraumatic Eyes: conjunctivae/corneas clear. PERRL, EOM's intact.  Ears: normal TM's and external ear canals both ears Nose: Nares normal. Septum midline.  Mild erythema to nasal mucosa.  PND noted.  No sinus tenderness.   Throat: lips, mucosa, and tongue normal; teeth and gums normal Neck: no adenopathy and supple, symmetrical, trachea midline Back: symmetric, no curvature. No CVA tenderness. Lungs: clear to auscultation bilaterally Heart: regular rate and rhythm, S1, S2 normal, no murmur, click, rub or gallop Extremities: extremities normal, atraumatic, no cyanosis or edema Pulses: 2+ and symmetric Skin: Small, circular pink lesion measuring approximately 3 mm in diameter with a small central, superficial break in the skin measuring approximately 1 mm in diameter to right posterior neck at the hairline.  1 mm of surrounding erythema.  No central clearing or "bull's-eye".  No fluctuance.  No underlying induration.  No drainage or bleeding.  Tick does not appear to be present.  2 small pink papules measuring approximately 1 mm in diameter adjacent to where the tick was removed, without surrounding erythema, edema, warmth to touch or any other abnormal findings.  3 small, discrete, pink papules measuring approximately 1 mm diameter, widely spaced out from one another, underneath right breast.  No other  abnormalities on exam of this site. Mobility and turgor normal, no abnormal pigmentation, no evidence of bleeding, temperature normal and texture normal.. No petechia, purpura, erythema, edema, ecchymosis, or warmth to the touch.  No lesions to hands, feet, wrists, or ankles. No other lesions or rashes, or breaks in skin noted.  Lymph nodes: No lymphadenopathy noted.  Neurologic: Alert and oriented X 3, normal strength and tone. Normal symmetric reflexes. Normal coordination and gait   Assessment:   Tick exposure  Plan:   Tick exposure and symptoms possibly consistent with tickborne illness and/or acute upper respiratory infection.   CBC, CMP, [place] spotted fever IgG/IgM, and Lyme antibody test pending.  Discussed limitations of these tests with the patient. Discussed wound care at home.  Discussed OTC symptomatic treatments.  Prescribed doxycycline and advised patient to follow-up with her primary care provider within the next 2 days for reevaluation or return to our clinic if needed. Discussed side/adverse effects of doxycycline. Red flag symptoms and indications to seek medical care discussed.

## 2018-08-01 LAB — CBC WITH DIFFERENTIAL/PLATELET
BASOS ABS: 0 10*3/uL (ref 0.0–0.2)
Basos: 0 %
EOS (ABSOLUTE): 0.2 10*3/uL (ref 0.0–0.4)
Eos: 2 %
Hematocrit: 42.2 % (ref 34.0–46.6)
Hemoglobin: 13.8 g/dL (ref 11.1–15.9)
Immature Grans (Abs): 0 10*3/uL (ref 0.0–0.1)
Immature Granulocytes: 0 %
LYMPHS ABS: 1.6 10*3/uL (ref 0.7–3.1)
Lymphs: 24 %
MCH: 30.7 pg (ref 26.6–33.0)
MCHC: 32.7 g/dL (ref 31.5–35.7)
MCV: 94 fL (ref 79–97)
MONOCYTES: 8 %
MONOS ABS: 0.5 10*3/uL (ref 0.1–0.9)
NEUTROS PCT: 66 %
Neutrophils Absolute: 4.5 10*3/uL (ref 1.4–7.0)
Platelets: 267 10*3/uL (ref 150–450)
RBC: 4.49 x10E6/uL (ref 3.77–5.28)
RDW: 12.5 % (ref 12.3–15.4)
WBC: 6.9 10*3/uL (ref 3.4–10.8)

## 2018-08-01 LAB — COMPREHENSIVE METABOLIC PANEL
ALT: 37 IU/L — AB (ref 0–32)
AST: 28 IU/L (ref 0–40)
Albumin/Globulin Ratio: 1.9 (ref 1.2–2.2)
Albumin: 4.7 g/dL (ref 3.5–5.5)
Alkaline Phosphatase: 65 IU/L (ref 39–117)
BUN/Creatinine Ratio: 18 (ref 9–23)
BUN: 13 mg/dL (ref 6–24)
Bilirubin Total: 0.3 mg/dL (ref 0.0–1.2)
CO2: 22 mmol/L (ref 20–29)
CREATININE: 0.73 mg/dL (ref 0.57–1.00)
Calcium: 9.9 mg/dL (ref 8.7–10.2)
Chloride: 104 mmol/L (ref 96–106)
GFR calc Af Amer: 106 mL/min/{1.73_m2} (ref >59)
GFR calc non Af Amer: 92 mL/min/{1.73_m2} (ref >59)
GLUCOSE: 85 mg/dL (ref 65–99)
Globulin, Total: 2.5 g/dL (ref 1.5–4.5)
Potassium: 4.9 mmol/L (ref 3.5–5.2)
Sodium: 141 mmol/L (ref 134–144)
Total Protein: 7.2 g/dL (ref 6.0–8.5)

## 2018-08-01 LAB — ROCKY MTN SPOTTED FVR ABS PNL(IGG+IGM)
RMSF IGM: 0.74 {index} (ref 0.00–0.89)
RMSF IgG: NEGATIVE

## 2018-08-01 LAB — LYME AB/WESTERN BLOT REFLEX: Lyme IgG/IgM Ab: <0.91 {ISR} (ref 0.00–0.90)

## 2018-08-03 NOTE — Progress Notes (Signed)
Carollee HerterShannon, Will you call the patient and inform her that her lab results came back?  The lab work was negative [facility] and Lyme disease.  I do want her to take the full 7-day course of her doxycycline and still follow-up with her primary care provider as recommended.  The remainder of her tests were normal, with the exception of her ALT.  This was just slightly elevated at 37, normal values are between 0 and 32.  This test is a measure of liver function.  Please advise her to follow-up with her primary care provider regarding this.

## 2018-09-25 ENCOUNTER — Emergency Department: Payer: Managed Care, Other (non HMO)

## 2018-09-25 ENCOUNTER — Emergency Department
Admission: EM | Admit: 2018-09-25 | Discharge: 2018-09-25 | Disposition: A | Discharge status: Home / Self Care | Payer: Managed Care, Other (non HMO) | Attending: Emergency Medicine | Admitting: Emergency Medicine

## 2018-09-25 ENCOUNTER — Encounter: Payer: Self-pay | Admitting: Medical Oncology

## 2018-09-25 DIAGNOSIS — M7122 Synovial cyst of popliteal space [Baker], left knee: Secondary | ICD-10-CM | POA: Insufficient documentation

## 2018-09-25 DIAGNOSIS — M25562 Pain in left knee: Secondary | ICD-10-CM | POA: Diagnosis present

## 2018-09-25 DIAGNOSIS — R2242 Localized swelling, mass and lump, left lower limb: Secondary | ICD-10-CM | POA: Insufficient documentation

## 2018-09-25 DIAGNOSIS — Z79899 Other long term (current) drug therapy: Secondary | ICD-10-CM | POA: Insufficient documentation

## 2018-09-25 DIAGNOSIS — I10 Essential (primary) hypertension: Secondary | ICD-10-CM | POA: Diagnosis not present

## 2018-09-25 DIAGNOSIS — E039 Hypothyroidism, unspecified: Secondary | ICD-10-CM | POA: Diagnosis not present

## 2018-09-25 NOTE — ED Provider Notes (Signed)
[facility] Emergency Department Provider Note    ____________________________________________   I have reviewed the triage vital signs and the nursing notes.   HISTORY  Chief Complaint Leg Pain   History limited by: Not Limited   HPI Kathryn Powers is a 57 y.o. female who presents to the emergency department today because of left leg pain and concern for possible blood clot. The pain is located at her left knee.  She states is charted about 1 month ago when she started having pain to the inside of her knee.  She does not recall any trauma to the knee.  Does not recall any unusual activity prior to the pain starting.  Since that time however the pain has moved somewhat behind the knee to the outside.  She has noticed some associated swelling.  Patient states that the pain is worse with flatfooted shoes. She denies similar symptoms in the past however does state she has a history of an injury to her meniscus in that knee a number of years ago. 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    Per medical record review patient has a history of htn, hypothyroidism.   Past Medical History:  Diagnosis Date  . Hypertension   . Hypothyroidism     There are no active problems to display for this patient.   No past surgical history on file.  Prior to Admission medications   Medication Sig Start Date End Date Taking? Authorizing Provider  atenolol (TENORMIN) 50 MG tablet Take by mouth. 01/30/16   [provider]  levothyroxine (SYNTHROID, LEVOTHROID) 100 MCG tablet Take by mouth. 01/30/16   [provider]  meloxicam (MOBIC) 15 MG tablet Take 1 tablet (15 mg total) by mouth daily. 05/04/18   Cuthriell, Delorise Royals, PA-C  PARoxetine (PAXIL) 20 MG tablet TAKE ONE TABLET BY MOUTH DAILY 01/12/18   [provider]  pravastatin (PRAVACHOL) 20 MG tablet Take by mouth. 11/15/16 11/15/17  [provider]     Allergies Codeine  Family History  Problem Relation Age of Onset  . Breast cancer Neg Hx     Social History Social History   Tobacco Use  . Smoking status: Never Smoker  . Smokeless tobacco: Never Used  Substance Use Topics  . Alcohol use: Yes    Alcohol/week: 0.0 standard drinks    Comment: occasional  . Drug use: No    Review of Systems Constitutional: No fever/chills Eyes: No visual changes. ENT: No sore throat. Cardiovascular: Denies chest pain. Respiratory: Denies shortness of breath. Gastrointestinal: No abdominal pain.  No nausea, no vomiting.  No diarrhea.   Genitourinary: Negative for dysuria. Musculoskeletal: Positive for left leg pain. Skin: Negative for rash. Neurological: Negative for headaches, focal weakness or numbness.  ____________________________________________   PHYSICAL EXAM:  VITAL SIGNS: ED Triage Vitals [09/25/18 1324]  Enc Vitals Group     BP 108/85     Pulse Rate 81     Resp 16     Temp 98.6 F (37 C)     Temp Source Oral     SpO2 97 %     Weight 116 lb (52.6 kg)     Height 4\' 11"  (1.499 m)     Head Circumference      Peak Flow      Pain Score 7   Constitutional: Alert and oriented.  Eyes: Conjunctivae are normal.  ENT      Head: Normocephalic and atraumatic.  Nose: No congestion/rhinnorhea.      Mouth/Throat: Mucous membranes are moist.      Neck: No stridor. Hematological/Lymphatic/Immunilogical: No cervical lymphadenopathy. Cardiovascular: Normal rate, regular rhythm.  No murmurs, rubs, or gallops.  Respiratory: Normal respiratory effort without tachypnea nor retractions. Breath sounds are clear and equal bilaterally. No wheezes/rales/rhonchi. Gastrointestinal: Soft and non tender. No rebound. No guarding.  Genitourinary: Deferred Musculoskeletal: Left knee with very mild swelling, tender to palpation to the popliteal fossa. No warmth. No erythema.  Neurologic:  Normal speech and language. No gross focal  neurologic deficits are appreciated.  Skin:  Skin is warm, dry and intact. No rash noted. Psychiatric: Mood and affect are normal. Speech and behavior are normal. Patient exhibits appropriate insight and judgment.  ____________________________________________    LABS (pertinent positives/negatives)  None  ____________________________________________   EKG  None  ____________________________________________    RADIOLOGY  US venous lle No DVT. Baker's cyst.  ____________________________________________   PROCEDURES  Procedures  ____________________________________________   INITIAL IMPRESSION / ASSESSMENT AND PLAN / ED COURSE  Pertinent labs & imaging results that were available during my care of the patient were reviewed by me and considered in my medical decision making (see chart for details).   Patient presented to the emergency department today because of concerns for left knee pain and possible blood clot.  On exam patient's knee is very minimally swollen however there is no erythema or warmth.  Patient with some mild tenderness to range of motion.  Patient ultrasound did not show a blood clot.  Did however show a Baker's cyst.  I do think this likely is causing the patient's pain and swelling.  Do wonder if she reinjured her meniscus given that the pain is initially was on the inside of her knee if this is set up inflammation.  Discussed with patient importance of orthopedic follow-up.   ____________________________________________   FINAL CLINICAL IMPRESSION(S) / ED DIAGNOSES  Final diagnoses:  Acute pain of left knee  Baker's cyst of knee, left     Note: This dictation was prepared with Dragon dictation. Any transcriptional errors that result from this process are unintentional     Phineas Semen, MD 09/26/18 [phone]

## 2018-09-25 NOTE — ED Notes (Signed)
Pt states health person at work referred her over for a possible DVT on back of left leg. Upon lifting of her jeans, there was not any apparent redness or swelling, but she reports pain.

## 2018-09-25 NOTE — ED Notes (Signed)
Pt taken to ultrasound. VSS.

## 2018-09-25 NOTE — ED Triage Notes (Signed)
Pt reports for a few days she has been having pain/swelling/redness to back of leg behind knee. Pt was sent here from work for possible DVT.

## 2018-09-25 NOTE — Discharge Instructions (Addendum)
Please seek medical attention for any high fevers, chest pain, shortness of breath, change in behavior, persistent vomiting, bloody stool or any other new or concerning symptoms.  

## 2018-10-27 ENCOUNTER — Other Ambulatory Visit: Payer: Self-pay | Admitting: Internal Medicine

## 2018-10-27 DIAGNOSIS — R7401 Elevation of levels of liver transaminase levels: Secondary | ICD-10-CM

## 2018-10-27 DIAGNOSIS — Z0000 Encounter for general adult medical examination without abnormal findings: Secondary | ICD-10-CM

## 2018-10-27 DIAGNOSIS — G8929 Other chronic pain: Secondary | ICD-10-CM | POA: Insufficient documentation

## 2018-10-27 DIAGNOSIS — R740 Nonspecific elevation of levels of transaminase and lactic acid dehydrogenase [LDH]: Principal | ICD-10-CM

## 2018-12-24 ENCOUNTER — Ambulatory Visit: Payer: Self-pay | Admitting: Emergency Medicine

## 2018-12-24 ENCOUNTER — Emergency Department: Payer: Managed Care, Other (non HMO)

## 2018-12-24 ENCOUNTER — Encounter: Payer: Self-pay | Admitting: *Deleted

## 2018-12-24 ENCOUNTER — Other Ambulatory Visit: Payer: Self-pay

## 2018-12-24 ENCOUNTER — Emergency Department
Admission: EM | Admit: 2018-12-24 | Discharge: 2018-12-24 | Disposition: A | Discharge status: Home / Self Care | Payer: Managed Care, Other (non HMO) | Attending: Emergency Medicine | Admitting: Emergency Medicine

## 2018-12-24 VITALS — Temp 97.9°F | Resp 14

## 2018-12-24 DIAGNOSIS — R1032 Left lower quadrant pain: Secondary | ICD-10-CM | POA: Insufficient documentation

## 2018-12-24 DIAGNOSIS — I951 Orthostatic hypotension: Secondary | ICD-10-CM

## 2018-12-24 DIAGNOSIS — R197 Diarrhea, unspecified: Secondary | ICD-10-CM

## 2018-12-24 DIAGNOSIS — R42 Dizziness and giddiness: Secondary | ICD-10-CM

## 2018-12-24 DIAGNOSIS — I10 Essential (primary) hypertension: Secondary | ICD-10-CM | POA: Diagnosis not present

## 2018-12-24 LAB — CBC
HEMATOCRIT: 43.3 % (ref 36.0–46.0)
Hemoglobin: 14.3 g/dL (ref 12.0–15.0)
MCH: 31.8 pg (ref 26.0–34.0)
MCHC: 33 g/dL (ref 30.0–36.0)
MCV: 96.4 fL (ref 80.0–100.0)
Platelets: 228 10*3/uL (ref 150–400)
RBC: 4.49 MIL/uL (ref 3.87–5.11)
RDW: 12.5 % (ref 11.5–15.5)
WBC: 5.3 10*3/uL (ref 4.0–10.5)
nRBC: 0 % (ref 0.0–0.2)

## 2018-12-24 LAB — POCT URINALYSIS DIPSTICK
BILIRUBIN UA: NEGATIVE
Blood, UA: NEGATIVE
GLUCOSE UA: NEGATIVE
Ketones, UA: NEGATIVE
Nitrite, UA: NEGATIVE
Protein, UA: NEGATIVE
Spec Grav, UA: 1.02 (ref 1.010–1.025)
Urobilinogen, UA: 0.2 E.U./dL
pH, UA: 5.5 (ref 5.0–8.0)

## 2018-12-24 LAB — URINALYSIS, COMPLETE (UACMP) WITH MICROSCOPIC
BACTERIA UA: NONE SEEN
BILIRUBIN URINE: NEGATIVE
Glucose, UA: NEGATIVE mg/dL
Hgb urine dipstick: NEGATIVE
Ketones, ur: NEGATIVE mg/dL
LEUKOCYTES UA: NEGATIVE
Nitrite: NEGATIVE
Protein, ur: NEGATIVE mg/dL
SPECIFIC GRAVITY, URINE: 1.018 (ref 1.005–1.030)
pH: 6 (ref 5.0–8.0)

## 2018-12-24 LAB — COMPREHENSIVE METABOLIC PANEL
ALK PHOS: 62 U/L (ref 38–126)
ALT: 32 U/L (ref 0–44)
AST: 24 U/L (ref 15–41)
Albumin: 4.6 g/dL (ref 3.5–5.0)
Anion gap: 8 (ref 5–15)
BUN: 18 mg/dL (ref 6–20)
CHLORIDE: 105 mmol/L (ref 98–111)
CO2: 25 mmol/L (ref 22–32)
CREATININE: 0.72 mg/dL (ref 0.44–1.00)
Calcium: 9.7 mg/dL (ref 8.9–10.3)
GFR calc Af Amer: >60 mL/min (ref >60)
GFR calc non Af Amer: >60 mL/min (ref >60)
Glucose, Bld: 96 mg/dL (ref 70–99)
Potassium: 3.9 mmol/L (ref 3.5–5.1)
SODIUM: 138 mmol/L (ref 135–145)
Total Bilirubin: 0.9 mg/dL (ref 0.3–1.2)
Total Protein: 7.9 g/dL (ref 6.5–8.1)

## 2018-12-24 LAB — LIPASE, BLOOD: LIPASE: 32 U/L (ref 11–51)

## 2018-12-24 MED ORDER — IOHEXOL 300 MG/ML  SOLN
75.0000 mL | Freq: Once | INTRAMUSCULAR | Status: AC | PRN
  Administered 2018-12-24: 75 mL via INTRAVENOUS
  Filled 2018-12-24: qty 75

## 2018-12-24 MED ORDER — DIPHENOXYLATE-ATROPINE 2.5-0.025 MG PO TABS
2.0000 | ORAL_TABLET | Freq: Once | ORAL | Status: AC
  Administered 2018-12-24: 2 via ORAL
  Filled 2018-12-24: qty 2

## 2018-12-24 MED ORDER — SODIUM CHLORIDE 0.9 % IV BOLUS
1000.0000 mL | Freq: Once | INTRAVENOUS | Status: AC
  Administered 2018-12-24: 1000 mL via INTRAVENOUS

## 2018-12-24 MED ORDER — LOPERAMIDE HCL 2 MG PO TABS: 2.0000 mg | ORAL_TABLET | Freq: Four times a day (QID) | ORAL | 0 refills | Status: DC | PRN

## 2018-12-24 NOTE — Discharge Instructions (Addendum)
Please take a clear liquid diet for the next 12 to 24 hours, then advance to a bland diet as tolerated.  You may continue to take loperamide as prescribed.  If you are able to obtain a stool sample, please bring it with you to your primary care physician's office for evaluation for possible infection.  Return to the emergency department if you develop severe pain, lightheadedness or fainting, fever, or any other symptoms concerning to you.

## 2018-12-24 NOTE — Progress Notes (Signed)
Subjective: Patient presents with a 5-day history of feeling poorly primarily with cramps and diarrhea.  She has had up to 8 bowel movements per day.  They were initially dark in color and now have changed to a yellowish colored watery stool.  She has not seen any bright red blood.  She has had no nausea vomiting or fever with this illness.  She has had no recent travel has not been on antibiotics and no one else at home has been sick.  Her colonoscopy was done 4 years ago.  She has noted decrease in urination and has started to feel lightheaded when she gets up. Review of systems: Noncontributory except as relates to this illness. Objective: Today's Vitals   12/24/18 1152  Resp: 14  Temp: 97.9 F (36.6 C)  TempSrc: Oral  SpO2: 100%  BP lying 110/80 pulse 70   B/p standing 98/68 pulse 82 There is no height or weight on file to calculate BMI.  There is no scleral icterus. Neck supple. Chest clear. Heart regular rate and rhythm. Abdomen is not distended.  Bowel sounds are present.  There is tenderness present in the upper epigastric area as well as in the lower abdominal area without rebound. Assessment: Patient presents with 4 days of bloating, cramps, and diarrhea.  No recent travel or antibiotics.  Colonoscopy up-to-date.  She is lightheaded when she is up.  She is mildly orthostatic on testing with blood pressure 110/80 pulse 70 on lying and standing blood pressure 98/68 with pulse 82.  Her specific gravity was 1.020 I do think the patient would benefit from IV fluids and CBC with c-Met.  She did have an elevated liver function test earlier this year she has had her gallbladder out.  Hopefully a specimen can be obtained for a GI pathogen panel. Plan:. Call shuttle for transport. Send urine culture. ER to be called regarding referral.

## 2018-12-24 NOTE — ED Notes (Signed)
Pt can not provide stool sample at this time but is updated to alert nurses in case stool sample is needed.

## 2018-12-24 NOTE — ED Triage Notes (Signed)
Pt to ED reporting diarrhea since Monday. 6-8 episodes a day with nol fevers and no vomiting. abd cramps. PT reports the stool is yellowish in color and watery. No blood or tarry coloring. No recent antibiotic use.

## 2018-12-24 NOTE — ED Notes (Signed)
First Nurse Note: Patient sent over from Acute Care with report of dehydration.

## 2018-12-24 NOTE — ED Provider Notes (Signed)
[facility] Emergency Department Provider Note  ____________________________________________  Time seen: Approximately 5:05 PM  I have reviewed the triage vital signs and the nursing notes.   HISTORY  Chief Complaint Diarrhea    HPI Kathryn Powers is a 58 y.o. female history of HTN, hypothyroidism with recent normal thyroid testing, presenting with 4 days of diarrhea.  The patient reports that for the past 4 days, she has been having 8-10 episodes of loose or watery stool.  She reports that eating makes it worse.  She has some mild lower abdominal cramping with this.  She has not had any nausea or vomiting, fevers or chills.  No recent antibiotics, travel outside the [place], or known sick contacts.  She has not had any cough or cold symptoms.  She has tried Imodium, Kaopectate and Tums without any improvement.  Past Medical History:  Diagnosis Date  . Hypertension   . Hypothyroidism     There are no active problems to display for this patient.   History reviewed. No pertinent surgical history.  Current Outpatient Rx  . Order #: [id number] Class: Historical Med  . Order #: [id number] Class: Historical Med  . Order #: [id number] Class: Print  . Order #: [id number] Class: Historical Med  . Order #: [id number] Class: Historical Med    Allergies Codeine  Family History  Problem Relation Age of Onset  . Breast cancer Neg Hx     Social History Social History   Tobacco Use  . Smoking status: Never Smoker  . Smokeless tobacco: Never Used  Substance Use Topics  . Alcohol use: Yes    Alcohol/week: 0.0 standard drinks    Comment: occasional  . Drug use: No    Review of Systems Constitutional: No fever/chills.  No lightheadedness or syncope. Eyes: No visual changes. ENT: No sore throat. No congestion or rhinorrhea. Cardiovascular: Denies chest pain. Denies palpitations. Respiratory: Denies shortness of breath.  No cough. Gastrointestinal:  Positive mild abdominal pain.  No nausea, no vomiting.  As of nonbloody diarrhea.  No constipation. Genitourinary: Negative for dysuria.  No urinary frequency. Musculoskeletal: Negative for back pain. Skin: Negative for rash. Neurological: Negative for headaches. No focal numbness, tingling or weakness.     ____________________________________________   PHYSICAL EXAM:  VITAL SIGNS: ED Triage Vitals [12/24/18 1257]  Enc Vitals Group     BP 124/84     Pulse Rate 76     Resp 16     Temp 98.5 F (36.9 C)     Temp Source Oral     SpO2 100 %     Weight 120 lb (54.4 kg)     Height 4' 10.5" (1.486 m)     Head Circumference      Peak Flow      Pain Score 0     Pain Loc      Pain Edu?      Excl. in GC?     Constitutional: Alert and oriented. Well appearing and in no acute distress. Answers questions appropriately. Eyes: Conjunctivae are normal.  EOMI. No scleral icterus. Head: Atraumatic. Nose: No congestion/rhinnorhea. Mouth/Throat: Mucous membranes are moist.  Neck: No stridor.  Supple.   Cardiovascular: Normal rate, regular rhythm. No murmurs, rubs or gallops.  Respiratory: Normal respiratory effort.  No accessory muscle use or retractions. Lungs CTAB.  No wheezes, rales or ronchi. Gastrointestinal: Soft, and nondistended.  Mild tenderness to palpation in the left lower quadrant.  No guarding or rebound.  No peritoneal signs. Musculoskeletal: No LE  edema.  Neurologic:  A&Ox3.  Speech is clear.  Face and smile are symmetric.  EOMI.  Moves all extremities well. Skin:  Skin is warm, dry and intact. No rash noted. Psychiatric: Mood and affect are normal. Speech and behavior are normal.  Normal judgement  ____________________________________________   LABS (all labs ordered are listed, but only abnormal results are displayed)  Labs Reviewed  URINALYSIS, COMPLETE (UACMP) WITH MICROSCOPIC - Abnormal; Notable for the following components:      Result Value   Color, Urine  YELLOW (*)    APPearance CLEAR (*)    All other components within normal limits  C DIFFICILE QUICK SCREEN W PCR REFLEX  GASTROINTESTINAL PANEL BY PCR, STOOL (REPLACES STOOL CULTURE)  LIPASE, BLOOD  COMPREHENSIVE METABOLIC PANEL  CBC   ____________________________________________  EKG  Not indicated ____________________________________________  RADIOLOGY  Ct Abdomen Pelvis W Contrast  Result Date: 12/24/2018 CLINICAL DATA:  Abdominal pain.  Diarrhea. EXAM: CT ABDOMEN AND PELVIS WITH CONTRAST TECHNIQUE: Multidetector CT imaging of the abdomen and pelvis was performed using the standard protocol following bolus administration of intravenous contrast. CONTRAST:  5mL OMNIPAQUE IOHEXOL 300 MG/ML  SOLN COMPARISON:  Abdominal ultrasound 06/24/2008 FINDINGS: Lower chest: No acute abnormality.  Small hiatal hernia. Hepatobiliary: No focal liver abnormality is seen. Status post cholecystectomy. No biliary dilatation. Pancreas: Unremarkable. No pancreatic ductal dilatation or surrounding inflammatory changes. Spleen: Normal in size without focal abnormality. Adrenals/Urinary Tract: Adrenal glands are unremarkable. Kidneys are normal, without renal calculi, focal lesion, or hydronephrosis. Right basilar bladder diverticulum. Stomach/Bowel: Stomach is within normal limits. Post appendectomy. No evidence of bowel wall thickening, distention, or inflammatory changes. Scattered colonic diverticulosis. Vascular/Lymphatic: Minimal aortic atherosclerosis. No enlarged abdominal or pelvic lymph nodes. Reproductive: Status post hysterectomy. No adnexal masses. Other: No abdominal wall hernia or abnormality. No abdominopelvic ascites. Musculoskeletal: No acute or significant osseous findings. IMPRESSION: No evidence of acute abnormalities within the solid abdominal organs. Small hiatal hernia. No evidence of small-bowel obstruction or significant inflammatory changes. Scattered colonic diverticulosis. Electronically  Signed   By: Ted Mcalpine M.D.   On: 12/24/2018 17:56    ____________________________________________   PROCEDURES  Procedure(s) performed: None  Procedures  Critical Care performed: No ____________________________________________   INITIAL IMPRESSION / ASSESSMENT AND PLAN / ED COURSE  Pertinent labs & imaging results that were available during my care of the patient were reviewed by me and considered in my medical decision making (see chart for details).  58 y.o. history of HTN and thyroid dysfunction, presenting with 4 days of diarrhea.  + LLQ pain on exam.  Consider infectious etiology, versus diverticulitis.  Plan CT and symptomatic tx.  The pt was concerned about dehydration and while she has mildly dry mucous membranes, her vital signs are normal, her creatinine is normal, her specific gravity in the urine is normal, and she has good skin turgor.  We will give her intravenous fluids.  Plan reevaluation for final disposition.  ----------------------------------------- 6:13 PM on 12/24/2018 -----------------------------------------  The patient was unable to provide a stool sample while she was here.  I will treat her with 2 tablets of Lomotil, and then have her continue her outpatient loperamide use.  Her CT scan does not show any acute process, including diverticulitis.  At this time, the patient is safe for discharge home.  Return precautions as well as follow-up instructions were discussed. ____________________________________________  FINAL CLINICAL IMPRESSION(S) / ED DIAGNOSES  Final diagnoses:  Diarrhea, unspecified type  Left lower quadrant abdominal pain  NEW MEDICATIONS STARTED DURING THIS VISIT:  New Prescriptions   LOPERAMIDE (IMODIUM A-D) 2 MG TABLET    Take 1 tablet (2 mg total) by mouth 4 (four) times daily as needed for diarrhea or loose stools.      Rockne MenghiniNorman, Anne-Caroline, MD 12/24/18 [phone]

## 2018-12-24 NOTE — ED Notes (Signed)
Pt states she feels weak.

## 2018-12-24 NOTE — ED Notes (Signed)
Patient transported to CT 

## 2018-12-24 NOTE — Patient Instructions (Signed)
Patient sent to the emergency room by shuttle for evaluation

## 2018-12-25 LAB — GASTROINTESTINAL PANEL BY PCR, STOOL (REPLACES STOOL CULTURE)

## 2018-12-26 LAB — URINE CULTURE

## 2018-12-28 ENCOUNTER — Telehealth: Payer: Self-pay

## 2018-12-28 NOTE — Telephone Encounter (Signed)
Contacted patient about her negative urine culture results. The patient is feeling much better since her visit. She is following up with her PCP per her last visit with [place].

## 2019-09-07 ENCOUNTER — Encounter: Payer: Self-pay | Admitting: Adult Health

## 2019-09-07 ENCOUNTER — Ambulatory Visit: Payer: Managed Care, Other (non HMO) | Admitting: Adult Health

## 2019-09-07 ENCOUNTER — Other Ambulatory Visit: Payer: Self-pay

## 2019-09-07 VITALS — BP 116/60 | HR 73 | Temp 98.1°F | Resp 16 | Ht 60.0 in | Wt 143.0 lb

## 2019-09-07 DIAGNOSIS — Z0189 Encounter for other specified special examinations: Secondary | ICD-10-CM | POA: Diagnosis not present

## 2019-09-07 DIAGNOSIS — Z008 Encounter for other general examination: Secondary | ICD-10-CM | POA: Diagnosis not present

## 2019-09-07 NOTE — Progress Notes (Signed)
[place] DOB: 58 y.o. MRN: [id number]  Subjective:  Here for Biometric Screen/brief exam Patient is a 58 year old female in no acute distress who comes the clinic for a biometric brief exam and biometric screening for her employer.  She denies any ongoing health complaints at this time.  She has a primary care provider Glendon Axe, MD who she sees regularly.   Patient  denies any fever, body aches,chills, rash, chest pain, shortness of breath, nausea, vomiting, or diarrhea.   Patient Active Problem List   Diagnosis Date Noted  . Chronic pain of left knee 10/27/2018  . Adjustment disorder with mixed anxiety and depressed mood 09/11/2017  . Increased MCV 09/11/2017  . Pure hypercholesterolemia 08/22/2016  . Acquired hypothyroidism 04/05/2015  . Bilateral hearing loss 04/05/2015  . Chronic neck pain 04/05/2015  . Vitamin D deficiency 04/05/2015    Current Outpatient Medications:  .  cyclobenzaprine (FLEXERIL) 5 MG tablet, Take 1-2 tablets as needed for Muscle spasm, Disp: , Rfl:  .  dicyclomine (BENTYL) 20 MG tablet, TAKE ONE TABLET BY MOUTH EVERY 6 HOURS AS NEEDED FOR UP TO 7 DAYS, Disp: , Rfl:  .  atenolol (TENORMIN) 50 MG tablet, Take by mouth., Disp: , Rfl:  .  levothyroxine (SYNTHROID, LEVOTHROID) 100 MCG tablet, Take by mouth., Disp: , Rfl:  .  loperamide (IMODIUM A-D) 2 MG tablet, Take 1 tablet (2 mg total) by mouth 4 (four) times daily as needed for diarrhea or loose stools., Disp: 12 tablet, Rfl: 0 .  Multiple Vitamin (MULTI-VITAMIN) tablet, Take by mouth., Disp: , Rfl:  .  pantoprazole (PROTONIX) 40 MG tablet, , Disp: , Rfl:  .  PARoxetine (PAXIL) 20 MG tablet, TAKE ONE TABLET BY MOUTH DAILY, Disp: , Rfl:  .  pravastatin (PRAVACHOL) 20 MG tablet, Take by mouth., Disp: , Rfl:   Objective: Blood pressure 116/60, pulse 73, temperature 98.1 F (36.7 C), temperature source Temporal, resp. rate 16, height 5'  (1.524 m), weight 143 lb (64.9 kg), SpO2 98 %. NAD, well-developed well-nourished HEENT: Within normal limits Neck: Normal, supple, no cervical lymphadenopathy Heart: Regular rate and rhythm Lungs: Clear to auscultation Patient is alert and oriented and responsive to questions Engages in eye contact with provider. Speaks in full sentences without any pauses without any shortness of breath or distress.  Patient moves on and off of exam table and in room without difficulty. Gait is normal in hall and in room. Patient is oriented to person place time and situation. Patient answers questions appropriately and engages in conversation.   Assessment: Biometric screen    ICD-10-CM   1. Encounter for other general examination- not a full annual physical brief biometric exam and screening   Z00.8 Glucose, random    Lipid Panel With LDL/HDL Ratio  2. Encounter for biometric screening  Z01.89 Glucose, random    Lipid Panel With LDL/HDL Ratio    Plan: Keep your regular scheduled appointment with your primary care provider in the next 2 weeks. Fasting glucose and lipids. Discussed with patient that today's visit here is a limited biometric screening visit (not a comprehensive exam or management of any chronic problems) Discussed some health issues, including healthy eating habits and exercise. Encouraged to follow-up with PCP for annual comprehensive preventive and wellness care (and if applicable, any chronic issues). Questions invited and answered.  I will have the office call you on your glucose and cholesterol results when they return if you have not  heard within 1 week please call the office.  This biometric physical is a brief physical and the only labs done are glucose and your lipid panel(cholesterol) and is  not a substitute for seeing a primary care provider for a complete annual physical. Please see a primary care physician for routine health maintenance, labs and full physical at least  yearly and follow up as recommended by your provider. Provider also recommends if you do not have a primary care provider for patient to establish care as soon as possible .Patient may chose provider of choice. Also gave the [place]  PHYSICIAN/PROVIDER  REFERRAL LINE at [phone] or web site at [email or web address] to help assist with finding a primary care doctor.  Patient verbalizes understanding that his office is acute care only and not a substitute for a primary care or for the management of chronic conditions.

## 2019-09-07 NOTE — Patient Instructions (Signed)
  I will have the office call you on your glucose and cholesterol results when they return if you have not heard within 1 week please call the office.  This biometric physical is a brief physical and the only labs done are glucose and your lipid panel(cholesterol) and is  not a substitute for seeing a primary care provider for a complete annual physical. Please see a primary care physician for routine health maintenance, labs and full physical at least yearly and follow up as recommended by your provider. Provider also recommends if you do not have a primary care provider for patient to establish care as soon as possible .Patient may chose provider of choice. Also gave the [place]  PHYSICIAN/PROVIDER  REFERRAL LINE at [phone]- [phone] or web site at [email or web address] to help assist with finding a primary care doctor.  Patient verbalizes understanding that his office is acute care only and not a substitute for a primary care or for the management of chronic conditions.    Health Maintenance, Female Adopting a healthy lifestyle and getting preventive care are important in promoting health and wellness. Ask your health care provider about:  The right schedule for you to have regular tests and exams.  Things you can do on your own to prevent diseases and keep yourself healthy. What should I know about diet, weight, and exercise? Eat a healthy diet   Eat a diet that includes plenty of vegetables, fruits, low-fat dairy products, and lean protein.  Do not eat a lot of foods that are high in solid fats, added sugars, or sodium. Maintain a healthy weight Body mass index (BMI) is used to identify weight problems. It estimates body fat based on height and weight. Your health care provider can help determine your BMI and help you achieve or maintain a healthy weight. Get regular exercise Get regular exercise. This is one of the most important things you can do for your health. Most adults should:   Exercise for at least 150 minutes each week. The exercise should increase your heart rate and make you sweat (moderate-intensity exercise).  Do strengthening exercises at least twice a week. This is in addition to the moderate-intensity exercise.  Spend less time sitting. Even light physical activity can be beneficial. Watch cholesterol and blood lipids Have your blood tested for lipids and cholesterol at 57 years of age, then have this test every 5 years. Have your cholesterol levels checked more often if:  Your lipid or cholesterol levels are high.  You are older than 57 years of age.  You are at high risk for heart disease. What should I know about cancer screening? Depending on your health history and family history, you may need to have cancer screening at various ages. This may include screening for:  Breast cancer.  Cervical cancer.  Colorectal cancer.  Skin cancer.  Lung cancer. What should I know about heart disease, diabetes, and high blood pressure? Blood pressure and heart disease  High blood pressure causes heart disease and increases the risk of stroke. This is more likely to develop in people who have high blood pressure readings, are of African descent, or are overweight.  Have your blood pressure checked: ? Every 3-5 years if you are 18-39 years of age. ? Every year if you are 40 years old or older. Diabetes Have regular diabetes screenings. This checks your fasting blood sugar level. Have the screening done:  Once every three years after age 40 if you are   at a normal weight and have a low risk for diabetes.  More often and at a younger age if you are overweight or have a high risk for diabetes. What should I know about preventing infection? Hepatitis B If you have a higher risk for hepatitis B, you should be screened for this virus. Talk with your health care provider to find out if you are at risk for hepatitis B infection. Hepatitis C Testing is  recommended for:  Everyone born from 1945 through 1965.  Anyone with known risk factors for hepatitis C. Sexually transmitted infections (STIs)  Get screened for STIs, including gonorrhea and chlamydia, if: ? You are sexually active and are younger than 57 years of age. ? You are older than 57 years of age and your health care provider tells you that you are at risk for this type of infection. ? Your sexual activity has changed since you were last screened, and you are at increased risk for chlamydia or gonorrhea. Ask your health care provider if you are at risk.  Ask your health care provider about whether you are at high risk for HIV. Your health care provider may recommend a prescription medicine to help prevent HIV infection. If you choose to take medicine to prevent HIV, you should first get tested for HIV. You should then be tested every 3 months for as long as you are taking the medicine. Pregnancy  If you are about to stop having your period (premenopausal) and you may become pregnant, seek counseling before you get pregnant.  Take 400 to 800 micrograms (mcg) of folic acid every day if you become pregnant.  Ask for birth control (contraception) if you want to prevent pregnancy. Osteoporosis and menopause Osteoporosis is a disease in which the bones lose minerals and strength with aging. This can result in bone fractures. If you are 65 years old or older, or if you are at risk for osteoporosis and fractures, ask your health care provider if you should:  Be screened for bone loss.  Take a calcium or vitamin D supplement to lower your risk of fractures.  Be given hormone replacement therapy (HRT) to treat symptoms of menopause. Follow these instructions at home: Lifestyle  Do not use any products that contain nicotine or tobacco, such as cigarettes, e-cigarettes, and chewing tobacco. If you need help quitting, ask your health care provider.  Do not use street drugs.  Do not  share needles.  Ask your health care provider for help if you need support or information about quitting drugs. Alcohol use  Do not drink alcohol if: ? Your health care provider tells you not to drink. ? You are pregnant, may be pregnant, or are planning to become pregnant.  If you drink alcohol: ? Limit how much you use to 0-1 drink a day. ? Limit intake if you are breastfeeding.  Be aware of how much alcohol is in your drink. In the [place] one drink equals one 12 oz bottle of beer (355 mL), one 5 oz glass of wine (148 mL), or one 1 oz glass of hard liquor (44 mL). General instructions  Schedule regular health, dental, and eye exams.  Stay current with your vaccines.  Tell your health care provider if: ? You often feel depressed. ? You have ever been abused or do not feel safe at home. Summary  Adopting a healthy lifestyle and getting preventive care are important in promoting health and wellness.  Follow your health care provider's instructions about   healthy diet, exercising, and getting tested or screened for diseases.  Follow your health care provider's instructions on monitoring your cholesterol and blood pressure. This information is not intended to replace advice given to you by your health care provider. Make sure you discuss any questions you have with your health care provider. Document Released: 06/17/2011 Document Revised: 11/25/2018 Document Reviewed: 11/25/2018 Elsevier Patient Education  2020 Elsevier Inc.  

## 2019-09-08 ENCOUNTER — Encounter: Payer: Self-pay | Admitting: Adult Health

## 2019-09-08 LAB — LIPID PANEL WITH LDL/HDL RATIO
Cholesterol, Total: 257 mg/dL — ABNORMAL HIGH (ref 100–199)
HDL: 72 mg/dL (ref >39)
LDL Chol Calc (NIH): 124 mg/dL — ABNORMAL HIGH (ref 0–99)
LDL/HDL Ratio: 1.7 ratio (ref 0.0–3.2)
Triglycerides: 350 mg/dL — ABNORMAL HIGH (ref 0–149)
VLDL Cholesterol Cal: 61 mg/dL — ABNORMAL HIGH (ref 5–40)

## 2019-09-08 LAB — GLUCOSE, RANDOM: Glucose: 98 mg/dL (ref 65–99)

## 2020-03-29 ENCOUNTER — Other Ambulatory Visit: Payer: Self-pay | Admitting: Internal Medicine

## 2020-03-29 DIAGNOSIS — R109 Unspecified abdominal pain: Secondary | ICD-10-CM

## 2020-03-30 ENCOUNTER — Ambulatory Visit
Admission: RE | Admit: 2020-03-30 | Discharge: 2020-03-30 | Disposition: A | Discharge status: Home / Self Care | Payer: Managed Care, Other (non HMO) | Source: Ambulatory Visit | Attending: Internal Medicine | Admitting: Internal Medicine

## 2020-03-30 ENCOUNTER — Other Ambulatory Visit: Payer: Self-pay

## 2020-03-30 DIAGNOSIS — R109 Unspecified abdominal pain: Secondary | ICD-10-CM | POA: Insufficient documentation

## 2020-03-30 MED ORDER — IOHEXOL 300 MG/ML  SOLN
100.0000 mL | Freq: Once | INTRAMUSCULAR | Status: AC | PRN
  Administered 2020-03-30: 100 mL via INTRAVENOUS

## 2020-04-27 ENCOUNTER — Other Ambulatory Visit: Payer: Self-pay | Admitting: Obstetrics and Gynecology

## 2020-04-27 DIAGNOSIS — Z1231 Encounter for screening mammogram for malignant neoplasm of breast: Secondary | ICD-10-CM

## 2020-05-31 ENCOUNTER — Other Ambulatory Visit: Payer: Self-pay

## 2020-05-31 ENCOUNTER — Encounter: Payer: Self-pay | Admitting: Nurse Practitioner

## 2020-05-31 ENCOUNTER — Ambulatory Visit: Payer: Managed Care, Other (non HMO) | Admitting: Nurse Practitioner

## 2020-05-31 VITALS — BP 122/78 | HR 86 | Resp 16 | Ht 59.0 in | Wt 141.0 lb

## 2020-05-31 DIAGNOSIS — Z008 Encounter for other general examination: Secondary | ICD-10-CM

## 2020-05-31 NOTE — Progress Notes (Signed)
  Subjective:     Patient ID: Kathryn Powers, female   DOB: 05-15-1961, 59 y.o.   MRN: [id number]  HPI Kathryn Powers is a 59 y.o. female who presents to the [facility] for her annual biometric screening exam. 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 It is busy but she enjoys her work. She reports having a little scratchy throat today but feels it is due to her allergies. Employee is on a low sodium diet for herself and her husband (who has had a stroke).   PMH: HTN, hypothyroidism, anxiety/depression, hearing loss, vit. D deficiency Surg: None SH: denies use of tobacco, ETOH or drugs.  Immunizations: UTD,  has had both doses of Covid vaccine,  Meds: List reviewed and updated Allergies: Codeine  Review of Systems  HENT: Positive for sore throat (scratchy throat).   All other systems reviewed and are negative.     Objective: BP 122/78   Pulse 86   Resp 16   Ht 4\' 11"  (1.499 m)   Wt 141 lb (64 kg)   BMI 28.48 kg/m     Physical Exam Constitutional:      General: She is not in acute distress.    Appearance: Normal appearance.  HENT:     Head: Normocephalic.     Right Ear: Tympanic membrane normal.     Left Ear: Tympanic membrane normal.     Nose: Nose normal.     Mouth/Throat:     Mouth: Mucous membranes are moist.     Pharynx: Oropharynx is clear.  Eyes:     Conjunctiva/sclera: Conjunctivae normal.  Neck:     Thyroid: No thyroid mass or thyroid tenderness.     Vascular: Normal carotid pulses. No carotid bruit or JVD.     Trachea: Trachea normal.  Cardiovascular:     Rate and Rhythm: Normal rate and regular rhythm.  Pulmonary:     Effort: Pulmonary effort is normal.     Breath sounds: Normal breath sounds and air entry.  Abdominal:     Tenderness: There is no abdominal tenderness.  Musculoskeletal:     Cervical back: Normal range of motion and neck supple.  Lymphadenopathy:     Cervical: No cervical adenopathy.  Neurological:     Mental Status:  She is alert.        Assessment:    59 y.o. female here for annual biometric screening exam. Limited physical exam is normal.     Plan:    Discussed with the patient diet and exercise written and verbal instructions given. Employee given opportunity to ask questions. All questions answered and employee voices understanding. Plan to return in one year or sooner as needed.

## 2020-05-31 NOTE — Patient Instructions (Signed)
Health Maintenance, Female Adopting a healthy lifestyle and getting preventive care are important in promoting health and wellness. Ask your health care provider about:  The right schedule for you to have regular tests and exams.  Things you can do on your own to prevent diseases and keep yourself healthy. What should I know about diet, weight, and exercise? Eat a healthy diet   Eat a diet that includes plenty of vegetables, fruits, low-fat dairy products, and lean protein.  Do not eat a lot of foods that are high in solid fats, added sugars, or sodium. Maintain a healthy weight Body mass index (BMI) is used to identify weight problems. It estimates body fat based on height and weight. Your health care provider can help determine your BMI and help you achieve or maintain a healthy weight. Get regular exercise Get regular exercise. This is one of the most important things you can do for your health. Most adults should:  Exercise for at least 150 minutes each week. The exercise should increase your heart rate and make you sweat (moderate-intensity exercise).  Do strengthening exercises at least twice a week. This is in addition to the moderate-intensity exercise.  Spend less time sitting. Even light physical activity can be beneficial. Watch cholesterol and blood lipids Have your blood tested for lipids and cholesterol at 58 years of age, then have this test every 5 years. Have your cholesterol levels checked more often if:  Your lipid or cholesterol levels are high.  You are older than 58 years of age.  You are at high risk for heart disease. What should I know about cancer screening? Depending on your health history and family history, you may need to have cancer screening at various ages. This may include screening for:  Breast cancer.  Cervical cancer.  Colorectal cancer.  Skin cancer.  Lung cancer. What should I know about heart disease, diabetes, and high blood  pressure? Blood pressure and heart disease  High blood pressure causes heart disease and increases the risk of stroke. This is more likely to develop in people who have high blood pressure readings, are of African descent, or are overweight.  Have your blood pressure checked: ? Every 3-5 years if you are 18-39 years of age. ? Every year if you are 40 years old or older. Diabetes Have regular diabetes screenings. This checks your fasting blood sugar level. Have the screening done:  Once every three years after age 40 if you are at a normal weight and have a low risk for diabetes.  More often and at a younger age if you are overweight or have a high risk for diabetes. What should I know about preventing infection? Hepatitis B If you have a higher risk for hepatitis B, you should be screened for this virus. Talk with your health care provider to find out if you are at risk for hepatitis B infection. Hepatitis C Testing is recommended for:  Everyone born from 1945 through 1965.  Anyone with known risk factors for hepatitis C. Sexually transmitted infections (STIs)  Get screened for STIs, including gonorrhea and chlamydia, if: ? You are sexually active and are younger than 58 years of age. ? You are older than 58 years of age and your health care provider tells you that you are at risk for this type of infection. ? Your sexual activity has changed since you were last screened, and you are at increased risk for chlamydia or gonorrhea. Ask your health care provider if   you are at risk.  Ask your health care provider about whether you are at high risk for HIV. Your health care provider may recommend a prescription medicine to help prevent HIV infection. If you choose to take medicine to prevent HIV, you should first get tested for HIV. You should then be tested every 3 months for as long as you are taking the medicine. Pregnancy  If you are about to stop having your period (premenopausal) and  you may become pregnant, seek counseling before you get pregnant.  Take 400 to 800 micrograms (mcg) of folic acid every day if you become pregnant.  Ask for birth control (contraception) if you want to prevent pregnancy. Osteoporosis and menopause Osteoporosis is a disease in which the bones lose minerals and strength with aging. This can result in bone fractures. If you are 65 years old or older, or if you are at risk for osteoporosis and fractures, ask your health care provider if you should:  Be screened for bone loss.  Take a calcium or vitamin D supplement to lower your risk of fractures.  Be given hormone replacement therapy (HRT) to treat symptoms of menopause. Follow these instructions at home: Lifestyle  Do not use any products that contain nicotine or tobacco, such as cigarettes, e-cigarettes, and chewing tobacco. If you need help quitting, ask your health care provider.  Do not use street drugs.  Do not share needles.  Ask your health care provider for help if you need support or information about quitting drugs. Alcohol use  Do not drink alcohol if: ? Your health care provider tells you not to drink. ? You are pregnant, may be pregnant, or are planning to become pregnant.  If you drink alcohol: ? Limit how much you use to 0-1 drink a day. ? Limit intake if you are breastfeeding.  Be aware of how much alcohol is in your drink. In the [place] one drink equals one 12 oz bottle of beer (355 mL), one 5 oz glass of wine (148 mL), or one 1 oz glass of hard liquor (44 mL). General instructions  Schedule regular health, dental, and eye exams.  Stay current with your vaccines.  Tell your health care provider if: ? You often feel depressed. ? You have ever been abused or do not feel safe at home. Summary  Adopting a healthy lifestyle and getting preventive care are important in promoting health and wellness.  Follow your health care provider's instructions about healthy  diet, exercising, and getting tested or screened for diseases.  Follow your health care provider's instructions on monitoring your cholesterol and blood pressure. This information is not intended to replace advice given to you by your health care provider. Make sure you discuss any questions you have with your health care provider. Document Revised: 11/25/2018 Document Reviewed: 11/25/2018 Elsevier Patient Education  2020 Elsevier Inc.  

## 2020-06-01 ENCOUNTER — Ambulatory Visit
Admission: RE | Admit: 2020-06-01 | Discharge: 2020-06-01 | Disposition: A | Discharge status: Home / Self Care | Payer: Managed Care, Other (non HMO) | Source: Ambulatory Visit | Attending: Obstetrics and Gynecology | Admitting: Obstetrics and Gynecology

## 2020-06-01 DIAGNOSIS — Z1231 Encounter for screening mammogram for malignant neoplasm of breast: Secondary | ICD-10-CM

## 2020-06-01 LAB — LIPID PANEL
Chol/HDL Ratio: 3.5 ratio (ref 0.0–4.4)
Cholesterol, Total: 234 mg/dL — ABNORMAL HIGH (ref 100–199)
HDL: 67 mg/dL (ref >39)
LDL Chol Calc (NIH): 122 mg/dL — ABNORMAL HIGH (ref 0–99)
Triglycerides: 257 mg/dL — ABNORMAL HIGH (ref 0–149)
VLDL Cholesterol Cal: 45 mg/dL — ABNORMAL HIGH (ref 5–40)

## 2020-06-01 LAB — GLUCOSE, RANDOM: Glucose: 90 mg/dL (ref 65–99)

## 2021-07-16 ENCOUNTER — Other Ambulatory Visit: Payer: Self-pay | Admitting: Internal Medicine

## 2021-07-16 DIAGNOSIS — Z1231 Encounter for screening mammogram for malignant neoplasm of breast: Secondary | ICD-10-CM

## 2021-07-19 ENCOUNTER — Other Ambulatory Visit: Payer: Managed Care, Other (non HMO)

## 2021-07-19 VITALS — BP 122/70 | HR 74 | Temp 97.6°F | Resp 15 | Ht <= 58 in | Wt 140.0 lb

## 2021-07-19 DIAGNOSIS — Z008 Encounter for other general examination: Secondary | ICD-10-CM

## 2021-07-21 LAB — GLUCOSE, RANDOM: Glucose: 81 mg/dL (ref 65–99)

## 2021-07-21 LAB — LIPID PANEL
Chol/HDL Ratio: 2.6 ratio (ref 0.0–4.4)
Cholesterol, Total: 176 mg/dL (ref 100–199)
HDL: 67 mg/dL (ref >39)
LDL Chol Calc (NIH): 79 mg/dL (ref 0–99)
Triglycerides: 180 mg/dL — ABNORMAL HIGH (ref 0–149)
VLDL Cholesterol Cal: 30 mg/dL (ref 5–40)

## 2021-07-25 ENCOUNTER — Other Ambulatory Visit: Payer: Self-pay

## 2021-07-25 ENCOUNTER — Ambulatory Visit
Admission: RE | Admit: 2021-07-25 | Discharge: 2021-07-25 | Disposition: A | Discharge status: Home / Self Care | Payer: Managed Care, Other (non HMO) | Source: Ambulatory Visit | Attending: Internal Medicine | Admitting: Internal Medicine

## 2021-07-25 DIAGNOSIS — Z1231 Encounter for screening mammogram for malignant neoplasm of breast: Secondary | ICD-10-CM | POA: Insufficient documentation

## 2021-08-14 ENCOUNTER — Emergency Department: Payer: Managed Care, Other (non HMO)

## 2021-08-14 ENCOUNTER — Other Ambulatory Visit: Payer: Self-pay

## 2021-08-14 ENCOUNTER — Emergency Department
Admission: EM | Admit: 2021-08-14 | Discharge: 2021-08-14 | Disposition: A | Discharge status: Home / Self Care | Payer: Managed Care, Other (non HMO) | Attending: Student in an Organized Health Care Education/Training Program | Admitting: Student in an Organized Health Care Education/Training Program

## 2021-08-14 DIAGNOSIS — M25522 Pain in left elbow: Secondary | ICD-10-CM | POA: Diagnosis not present

## 2021-08-14 DIAGNOSIS — Z79899 Other long term (current) drug therapy: Secondary | ICD-10-CM | POA: Diagnosis not present

## 2021-08-14 DIAGNOSIS — S52602A Unspecified fracture of lower end of left ulna, initial encounter for closed fracture: Secondary | ICD-10-CM | POA: Diagnosis not present

## 2021-08-14 DIAGNOSIS — Z8781 Personal history of (healed) traumatic fracture: Secondary | ICD-10-CM

## 2021-08-14 DIAGNOSIS — I10 Essential (primary) hypertension: Secondary | ICD-10-CM | POA: Diagnosis not present

## 2021-08-14 DIAGNOSIS — S52502A Unspecified fracture of the lower end of left radius, initial encounter for closed fracture: Secondary | ICD-10-CM | POA: Diagnosis not present

## 2021-08-14 DIAGNOSIS — S6992XA Unspecified injury of left wrist, hand and finger(s), initial encounter: Secondary | ICD-10-CM | POA: Diagnosis present

## 2021-08-14 DIAGNOSIS — E039 Hypothyroidism, unspecified: Secondary | ICD-10-CM | POA: Insufficient documentation

## 2021-08-14 DIAGNOSIS — T148XXA Other injury of unspecified body region, initial encounter: Secondary | ICD-10-CM

## 2021-08-14 DIAGNOSIS — W010XXA Fall on same level from slipping, tripping and stumbling without subsequent striking against object, initial encounter: Secondary | ICD-10-CM | POA: Diagnosis not present

## 2021-08-14 MED ORDER — FENTANYL CITRATE PF 50 MCG/ML IJ SOSY
50.0000 ug | PREFILLED_SYRINGE | Freq: Once | INTRAMUSCULAR | Status: AC
  Administered 2021-08-14: 50 ug via INTRAVENOUS
  Filled 2021-08-14: qty 1

## 2021-08-14 MED ORDER — LIDOCAINE HCL (PF) 1 % IJ SOLN: 30.0000 mL | Freq: Once | INTRAMUSCULAR | Status: DC

## 2021-08-14 MED ORDER — KETAMINE HCL 50 MG/5ML IJ SOSY
0.5000 mg/kg | PREFILLED_SYRINGE | Freq: Once | INTRAMUSCULAR | Status: DC
  Filled 2021-08-14: qty 5

## 2021-08-14 MED ORDER — KETAMINE HCL 50 MG/5ML IJ SOSY
1.0000 mg/kg | PREFILLED_SYRINGE | Freq: Once | INTRAMUSCULAR | Status: AC
  Administered 2021-08-14: 59 mg via INTRAVENOUS
  Filled 2021-08-14: qty 10

## 2021-08-14 MED ORDER — ONDANSETRON 4 MG PO TBDP
4.0000 mg | ORAL_TABLET | Freq: Once | ORAL | Status: AC
  Administered 2021-08-14: 4 mg via ORAL
  Filled 2021-08-14: qty 1

## 2021-08-14 MED ORDER — LIDOCAINE HCL 1 % IJ SOLN
30.0000 mL | Freq: Once | INTRAMUSCULAR | Status: AC
  Administered 2021-08-14: 30 mL
  Filled 2021-08-14: qty 30

## 2021-08-14 MED ORDER — OXYCODONE-ACETAMINOPHEN 5-325 MG PO TABS: 1.0000 | ORAL_TABLET | ORAL | 0 refills | Status: DC | PRN

## 2021-08-14 MED ORDER — MORPHINE SULFATE (PF) 4 MG/ML IV SOLN
4.0000 mg | Freq: Once | INTRAVENOUS | Status: AC
  Administered 2021-08-14: 4 mg via INTRAMUSCULAR
  Filled 2021-08-14: qty 1

## 2021-08-14 MED ORDER — OXYCODONE-ACETAMINOPHEN 5-325 MG PO TABS
1.0000 | ORAL_TABLET | Freq: Once | ORAL | Status: AC
  Administered 2021-08-14: 1 via ORAL
  Filled 2021-08-14: qty 1

## 2021-08-14 MED ORDER — KETAMINE HCL 10 MG/ML IJ SOLN
INTRAMUSCULAR | Status: AC | PRN
  Administered 2021-08-14: 50 mg via INTRAVENOUS

## 2021-08-14 MED ORDER — ONDANSETRON 4 MG PO TBDP: 4.0000 mg | ORAL_TABLET | Freq: Three times a day (TID) | ORAL | 0 refills | Status: DC | PRN

## 2021-08-14 MED ORDER — FENTANYL CITRATE PF 50 MCG/ML IJ SOSY
50.0000 ug | PREFILLED_SYRINGE | Freq: Once | INTRAMUSCULAR | Status: AC
  Administered 2021-08-14: 50 ug via INTRAMUSCULAR
  Filled 2021-08-14: qty 1

## 2021-08-14 NOTE — ED Triage Notes (Signed)
Pt to ED after fall from tripping over dog toy, reports hearing pop and injury to left arm Denies hitting head or LOC Obvious deformity noted to left forearm.  Pt appears uncomfortbable

## 2021-08-14 NOTE — Consult Note (Signed)
Contacted by Greig Right in ER regarding this 60 y/o female who fell at home injurying her left wrist.  I have reviewed xray films showing dorsal angulation of a distal both bone forearm fracture.   Skin is reported to be intact.   Patient is reported to be NVI.  I recommended patient could have a reduction by ER physician if possible.  With or without reduction, I recommended she be placed in a sugar tong splint and referred to the office for further evaluation and management later this week.

## 2021-08-14 NOTE — ED Notes (Signed)
See triage note  Presents with injury to left f/a  States she tripped over her dog's bones  Landed on left arm  Positive deformity noted to forearm  Good pulses

## 2021-08-14 NOTE — ED Provider Notes (Signed)
[facility] Emergency Department Provider Note  ____________________________________________   Event Date/Time   First MD Initiated Contact with Patient 08/14/21 1008     (approximate)  I have reviewed the triage vital signs and the nursing notes.   HISTORY  Chief Complaint Fall and Arm Injury    HPI Adrina Armijo is a 60 y.o. female presents emergency department after a fall this morning.  Patient tripped over a dog bone.  Landed on left arm and felt a pop.  States arm looks deformed.  Pain at the elbow and the wrist.  No head injury.  No other complaints at this time.  Past Medical History:  Diagnosis Date   Hypertension    pt is not on any meds for HTN   Hypothyroidism     Patient Active Problem List   Diagnosis Date Noted   Chronic pain of left knee 10/27/2018   Adjustment disorder with mixed anxiety and depressed mood 09/11/2017   Increased MCV 09/11/2017   Pure hypercholesterolemia 08/22/2016   Acquired hypothyroidism 04/05/2015   Bilateral hearing loss 04/05/2015   Chronic neck pain 04/05/2015   Vitamin D deficiency 04/05/2015    No past surgical history on file.  Prior to Admission medications   Medication Sig Start Date End Date Taking? Authorizing Provider  atenolol (TENORMIN) 50 MG tablet Take by mouth. 01/30/16   [provider]  cyclobenzaprine (FLEXERIL) 5 MG tablet Take 1-2 tablets as needed for Muscle spasm 08/31/19   [provider]  levothyroxine (SYNTHROID, LEVOTHROID) 100 MCG tablet Take by mouth. 01/30/16   [provider]  loperamide (IMODIUM A-D) 2 MG tablet Take 1 tablet (2 mg total) by mouth 4 (four) times daily as needed for diarrhea or loose stools. 12/24/18   Rockne Menghini, MD  Multiple Vitamin (MULTI-VITAMIN) tablet Take by mouth.    [provider]  pantoprazole (PROTONIX) 40 MG tablet  08/15/19   [provider]  PARoxetine (PAXIL) 20 MG tablet TAKE ONE TABLET BY  MOUTH DAILY 01/12/18   [provider]  pravastatin (PRAVACHOL) 20 MG tablet Take by mouth. 11/15/16 11/15/17  [provider]    Allergies Codeine  Family History  Problem Relation Age of Onset   Breast cancer Neg Hx     Social History Social History   Tobacco Use   Smoking status: Never   Smokeless tobacco: Never  Substance Use Topics   Alcohol use: Yes    Alcohol/week: 0.0 standard drinks    Comment: occasional   Drug use: No    Review of Systems  Constitutional: No fever/chills Eyes: No visual changes. ENT: No sore throat. Respiratory: Denies cough Cardiovascular: Denies chest pain Gastrointestinal: Denies abdominal pain Genitourinary: Negative for dysuria. Musculoskeletal: Negative for back pain.  Positive for left wrist and arm pain Skin: Negative for rash. Psychiatric: no mood changes,     ____________________________________________   PHYSICAL EXAM:  VITAL SIGNS: ED Triage Vitals  Enc Vitals Group     BP 08/14/21 0932 (!) 110/93     Pulse Rate 08/14/21 0932 87     Resp 08/14/21 0932 20     Temp 08/14/21 0932 98.8 F (37.1 C)     Temp Source 08/14/21 0932 Oral     SpO2 08/14/21 0932 94 %     Weight 08/14/21 0933 130 lb (59 kg)     Height 08/14/21 0933 4\' 10"  (1.473 m)     Head Circumference --      Peak  Flow --      Pain Score 08/14/21 0933 10     Pain Loc --      Pain Edu? --      Excl. in GC? --     Constitutional: Alert and oriented. Well appearing and in no acute distress. Eyes: Conjunctivae are normal.  Head: Atraumatic. Nose: No congestion/rhinnorhea. Mouth/Throat: Mucous membranes are moist.   Neck:  supple no lymphadenopathy noted Cardiovascular: Normal rate, regular rhythm.  Respiratory: Normal respiratory effort.  No retractions GU: deferred Musculoskeletal: Positive deformity at the left wrist, left wrist very tender to palpation with large hematoma noted, neurovascular is intact, left elbow is tender to  palpation, left humerus is nontender, left knee has an abrasion noted, no bony tenderness appreciated neurologic:  Normal speech and language.  Skin:  Skin is warm, dry  No rash noted. Psychiatric: Mood and affect are normal. Speech and behavior are normal.  ____________________________________________   LABS (all labs ordered are listed, but only abnormal results are displayed)  Labs Reviewed - No data to display ____________________________________________   ____________________________________________  RADIOLOGY  X-ray of the left forearm ordered from triage  ____________________________________________   PROCEDURES  Procedure(s) performed: See physician note for reduction  Procedures    ____________________________________________   INITIAL IMPRESSION / ASSESSMENT AND PLAN / ED COURSE  Pertinent labs & imaging results that were available during my care of the patient were reviewed by me and considered in my medical decision making (see chart for details).   Patient's 60 year old female presents with left arm pain after a fall.  See HPI.  Physical exam shows deformity left forearm  DDx: Radial fracture, ulnar fracture, olecranon fracture, radial head fracture, dislocation  X-ray of the left wrist reviewed by me confirmed by radiology to have a displaced fracture of the distal radius and ulna, radiologist calls it impacted  Consult to orthopedics, Dr. Martha Clan would like for [place] to reduce it in the ED and have her follow-up in the office  Dr. Sidney Ace to reduce the wrist     Ellianne Gowen was evaluated in Emergency Department on 08/14/2021 for the symptoms described in the history of present illness. She was evaluated in the context of the global COVID-19 pandemic, which necessitated consideration that the patient might be at risk for infection with the SARS-CoV-2 virus that causes COVID-19. Institutional protocols and algorithms that pertain to the evaluation of  patients at risk for COVID-19 are in a state of rapid change based on information released by regulatory bodies including the CDC and federal and state organizations. These policies and algorithms were followed during the patient's care in the ED.    As part of my medical decision making, I reviewed the following data within the electronic MEDICAL RECORD NUMBER Nursing notes reviewed and incorporated, Old chart reviewed, Radiograph reviewed , A consult was requested and obtained from this/these consultant(s) Orthopedics, Evaluated by EM attending dr Sidney Ace, Notes from prior ED visits, and [place] Controlled Substance Database  ____________________________________________   FINAL CLINICAL IMPRESSION(S) / ED DIAGNOSES  Final diagnoses:  Fracture  Closed fracture of distal end of left ulna, unspecified fracture morphology, initial encounter  Closed fracture of distal end of left radius, unspecified fracture morphology, initial encounter      NEW MEDICATIONS STARTED DURING THIS VISIT:  New Prescriptions   No medications on file     Note:  This document was prepared using Dragon voice recognition software and may include unintentional dictation errors.    Greig Right  W, PA-C 08/14/21 1229    Georga Hacking, MD 08/14/21 [phone]

## 2021-08-14 NOTE — ED Provider Notes (Signed)
.  Sedation  Date/Time: 08/14/2021 1:31 PM Performed by: Concha Se, MD Authorized by: Concha Se, MD   Consent:    Consent obtained:  Verbal and written   Consent given by:  Patient   Risks discussed:  Allergic reaction, nausea, vomiting, respiratory compromise necessitating ventilatory assistance and intubation, prolonged sedation necessitating reversal and prolonged hypoxia resulting in organ damage   Alternatives discussed:  Analgesia without sedation and regional anesthesia Universal protocol:    Immediately prior to procedure, a time out was called: yes   Indications:    Procedure performed:  Dislocation reduction   Procedure necessitating sedation performed by:  Different physician Pre-sedation assessment:    Time since last food or drink:  12 hours   ASA classification: class 1 - normal, healthy patient     Mallampati score:  I - soft palate, uvula, fauces, pillars visible   Neck mobility: normal     Pre-sedation assessments completed and reviewed: airway patency, cardiovascular function, hydration status, mental status, nausea/vomiting, pain level, respiratory function and temperature     Pre-sedation assessment completed:  08/14/2021 12:32 PM Immediate pre-procedure details:    Reassessment: Patient reassessed immediately prior to procedure     Reviewed: vital signs, relevant labs/tests and NPO status     Verified: bag valve mask available, emergency equipment available and intubation equipment available   Procedure details (see MAR for exact dosages):    Preoxygenation:  Room air   Sedation:  Ketamine   Intended level of sedation: deep   Intra-procedure monitoring:  Blood pressure monitoring, continuous capnometry, frequent LOC assessments, frequent vital sign checks, continuous pulse oximetry and cardiac monitor   Intra-procedure events: none     Total Provider sedation time (minutes):  15 Post-procedure details:    Procedure completion:  Tolerated well, no immediate  complications    Concha Se, MD 08/14/21 1332

## 2021-08-14 NOTE — Discharge Instructions (Addendum)
Follow-up with your regular doctor as needed Follow-up with [facility].  Please call for an appointment.  Tell them we discussed your case with Dr. Martha Clan.  He would like to see you this week Keep the wrist elevated and iced

## 2021-08-14 NOTE — ED Provider Notes (Signed)
.  Ortho Injury Treatment  Date/Time: 08/14/2021 3:48 PM Performed by: Georga Hacking, MD Authorized by: Georga Hacking, MD   Consent:    Consent obtained:  Verbal   Consent given by:  Patient   Risks discussed:  Fracture   Alternatives discussed:  No treatment, immobilization and referralInjury location: wrist Location details: left wrist Injury type: fracture Fracture type: distal radius and ulnar styloid Pre-procedure neurovascular assessment: neurovascularly intact Pre-procedure distal perfusion: normal Pre-procedure neurological function: normal Pre-procedure range of motion: normal Anesthesia: hematoma block  Anesthesia: Local anesthesia used: yes Local Anesthetic: lidocaine 1% with epinephrine Anesthetic total: 15 mL  Patient sedated: Yes. Refer to sedation procedure documentation for details of sedation. Manipulation performed: yes Skin traction used: yes Skeletal traction used: yes Reduction successful: no X-ray confirmed reduction: yes Immobilization: splint Splint type: sugar tong Splint Applied by: ED Provider and ED Tech Supplies used: Ortho-Glass Post-procedure neurovascular assessment: post-procedure neurovascularly intact Post-procedure distal perfusion: normal   Georga Hacking, MD 08/14/21 1551

## 2021-08-15 ENCOUNTER — Other Ambulatory Visit: Payer: Self-pay | Admitting: Orthopedic Surgery

## 2021-08-17 ENCOUNTER — Other Ambulatory Visit
Admission: RE | Admit: 2021-08-17 | Discharge: 2021-08-17 | Disposition: A | Discharge status: Home / Self Care | Payer: Managed Care, Other (non HMO) | Source: Ambulatory Visit | Attending: Orthopedic Surgery | Admitting: Orthopedic Surgery

## 2021-08-17 ENCOUNTER — Encounter: Payer: Self-pay | Admitting: Urgent Care

## 2021-08-17 ENCOUNTER — Other Ambulatory Visit: Payer: Self-pay

## 2021-08-17 DIAGNOSIS — Z01810 Encounter for preprocedural cardiovascular examination: Secondary | ICD-10-CM

## 2021-08-17 DIAGNOSIS — Z01818 Encounter for other preprocedural examination: Secondary | ICD-10-CM | POA: Insufficient documentation

## 2021-08-17 HISTORY — DX: Cardiac arrhythmia, unspecified: I49.9

## 2021-08-17 HISTORY — DX: Personal history of urinary calculi: Z87.442

## 2021-08-17 HISTORY — DX: Other complications of anesthesia, initial encounter: T88.59XA

## 2021-08-17 HISTORY — DX: Anemia, unspecified: D64.9

## 2021-08-17 LAB — BASIC METABOLIC PANEL
Anion gap: 6 (ref 5–15)
BUN: 13 mg/dL (ref 6–20)
CO2: 30 mmol/L (ref 22–32)
Calcium: 9.5 mg/dL (ref 8.9–10.3)
Chloride: 102 mmol/L (ref 98–111)
Creatinine, Ser: 0.73 mg/dL (ref 0.44–1.00)
GFR, Estimated: >60 mL/min (ref >60)
Glucose, Bld: 101 mg/dL — ABNORMAL HIGH (ref 70–99)
Potassium: 4.2 mmol/L (ref 3.5–5.1)
Sodium: 138 mmol/L (ref 135–145)

## 2021-08-17 LAB — CBC WITH DIFFERENTIAL/PLATELET
Abs Immature Granulocytes: 0.01 10*3/uL (ref 0.00–0.07)
Basophils Absolute: 0.1 10*3/uL (ref 0.0–0.1)
Basophils Relative: 1 %
Eosinophils Absolute: 0.2 10*3/uL (ref 0.0–0.5)
Eosinophils Relative: 2 %
HCT: 37.1 % (ref 36.0–46.0)
Hemoglobin: 12.9 g/dL (ref 12.0–15.0)
Immature Granulocytes: 0 %
Lymphocytes Relative: 26 %
Lymphs Abs: 1.9 10*3/uL (ref 0.7–4.0)
MCH: 32.3 pg (ref 26.0–34.0)
MCHC: 34.8 g/dL (ref 30.0–36.0)
MCV: 93 fL (ref 80.0–100.0)
Monocytes Absolute: 0.8 10*3/uL (ref 0.1–1.0)
Monocytes Relative: 11 %
Neutro Abs: 4.3 10*3/uL (ref 1.7–7.7)
Neutrophils Relative %: 60 %
Platelets: 240 10*3/uL (ref 150–400)
RBC: 3.99 MIL/uL (ref 3.87–5.11)
RDW: 12 % (ref 11.5–15.5)
WBC: 7.2 10*3/uL (ref 4.0–10.5)
nRBC: 0 % (ref 0.0–0.2)

## 2021-08-17 LAB — PROTIME-INR
INR: 0.9 (ref 0.8–1.2)
Prothrombin Time: 12.5 seconds (ref 11.4–15.2)

## 2021-08-17 LAB — APTT: aPTT: 28 seconds (ref 24–36)

## 2021-08-17 NOTE — Patient Instructions (Addendum)
Your procedure is scheduled on: Thursday August 23, 2021. Report to Day Surgery inside [facility] 2nd floor stop by admissions desk first before getting on elevator. To find out your arrival time please call [phone] between 1PM - 3PM on Wednesday August 22, 2021.  Remember: Instructions that are not followed completely may result in serious medical risk,  up to and including death, or upon the discretion of your surgeon and anesthesiologist your  surgery may need to be rescheduled.     _X__ 1. Do not eat food or drink fluids after midnight the night before your procedure.                 No chewing gum or hard candies.   __X__2.  On the morning of surgery brush your teeth with toothpaste and water, you                may rinse your mouth with mouthwash if you wish.  Do not swallow any toothpaste of mouthwash.     _X__ 3.  No Alcohol for 24 hours before or after surgery.   _X__ 4.  Do Not Smoke or use e-cigarettes For 24 Hours Prior to Your Surgery.                 Do not use any chewable tobacco products for at least 6 hours prior to                 Surgery.  _X__  5.  Do not use any recreational drugs (marijuana, cocaine, heroin, ecstasy, MDMA or other)                For at least one week prior to your surgery.  Combination of these drugs with anesthesia                May have life threatening results.  __X__6.  Notify your doctor if there is any change in your medical condition      (cold, fever, infections).     Do not wear jewelry, make-up, hairpins, clips or nail polish. Do not wear lotions, powders, or perfumes deodorant. Do not shave 48 hours prior to surgery.  Do not bring valuables to the hospital.    [facility] is not responsible for any belongings or valuables.  Contacts, dentures or bridgework may not be worn into surgery. Leave your suitcase in the car. After surgery it may be brought to your room. For patients admitted to the  hospital, discharge time is determined by your treatment team.   Patients discharged the day of surgery will not be allowed to drive home.   Make arrangements for someone to be with you for the first 24 hours of your Same Day Discharge.  __X__ Take these medicines the morning of surgery with A SIP OF WATER:    1. atenolol (TENORMIN) 50 MG   2. famotidine (PEPCID) 20 MG  3. levothyroxine (SYNTHROID) 112 MCG  4. PARoxetine (PAXIL) 20 MG  5. rosuvastatin (CRESTOR) 20 MG   6.  ____ Fleet Enema (as directed)   __X__ Use CHG Soap (or wipes) as directed  ____ Use Benzoyl Peroxide Gel as instructed  ____ Use inhalers on the day of surgery  ____ Stop metformin 2 days prior to surgery    ____ Take 1/2 of usual insulin dose the night before surgery. No insulin the morning          of surgery.   ____ Call your  PCP, cardiologist, or Pulmonologist if taking Coumadin/Plavix/aspirin and ask when to stop before your surgery.   __X__ One Week prior to surgery- Stop Anti-inflammatories such as Ibuprofen, Aleve, Advil, Motrin, meloxicam (MOBIC), diclofenac, etodolac, ketorolac, Toradol, Daypro, piroxicam, Goody's or BC powders. OK TO USE TYLENOL IF NEEDED   __X__ Stop supplements until after surgery. bismuth subsalicylate (PEPTO BISMOL) and Multiple Vitamin (MULTI-VITAMIN)   ____ Bring C-Pap to the hospital.    If you have any questions regarding your pre-procedure instructions,  Please call Pre-admit Testing at [phone].

## 2021-08-23 ENCOUNTER — Ambulatory Visit: Payer: Managed Care, Other (non HMO)

## 2021-08-23 ENCOUNTER — Encounter: Payer: Self-pay | Admitting: Orthopedic Surgery

## 2021-08-23 ENCOUNTER — Ambulatory Visit
Admission: RE | Admit: 2021-08-23 | Discharge: 2021-08-23 | Disposition: A | Discharge status: Home / Self Care | Payer: Managed Care, Other (non HMO) | Attending: Orthopedic Surgery | Admitting: Orthopedic Surgery

## 2021-08-23 ENCOUNTER — Ambulatory Visit: Payer: Managed Care, Other (non HMO) | Admitting: Anesthesiology

## 2021-08-23 ENCOUNTER — Encounter
Admission: RE | Disposition: A | Discharge status: Home / Self Care | Payer: Self-pay | Source: Home / Self Care | Attending: Orthopedic Surgery

## 2021-08-23 ENCOUNTER — Other Ambulatory Visit: Payer: Self-pay

## 2021-08-23 DIAGNOSIS — Z888 Allergy status to other drugs, medicaments and biological substances status: Secondary | ICD-10-CM | POA: Diagnosis not present

## 2021-08-23 DIAGNOSIS — S52532A Colles' fracture of left radius, initial encounter for closed fracture: Secondary | ICD-10-CM | POA: Insufficient documentation

## 2021-08-23 DIAGNOSIS — S52692A Other fracture of lower end of left ulna, initial encounter for closed fracture: Secondary | ICD-10-CM | POA: Insufficient documentation

## 2021-08-23 DIAGNOSIS — S62102A Fracture of unspecified carpal bone, left wrist, initial encounter for closed fracture: Secondary | ICD-10-CM

## 2021-08-23 DIAGNOSIS — Z885 Allergy status to narcotic agent status: Secondary | ICD-10-CM | POA: Diagnosis not present

## 2021-08-23 DIAGNOSIS — Z79899 Other long term (current) drug therapy: Secondary | ICD-10-CM | POA: Diagnosis not present

## 2021-08-23 DIAGNOSIS — X58XXXA Exposure to other specified factors, initial encounter: Secondary | ICD-10-CM | POA: Insufficient documentation

## 2021-08-23 DIAGNOSIS — S52602A Unspecified fracture of lower end of left ulna, initial encounter for closed fracture: Secondary | ICD-10-CM | POA: Diagnosis present

## 2021-08-23 DIAGNOSIS — Z791 Long term (current) use of non-steroidal anti-inflammatories (NSAID): Secondary | ICD-10-CM | POA: Insufficient documentation

## 2021-08-23 DIAGNOSIS — G540 Brachial plexus disorders: Secondary | ICD-10-CM

## 2021-08-23 HISTORY — PX: OPEN REDUCTION INTERNAL FIXATION (ORIF) DISTAL RADIAL FRACTURE: SHX5989

## 2021-08-23 LAB — GLUCOSE, CAPILLARY: Glucose-Capillary: 128 mg/dL — ABNORMAL HIGH (ref 70–99)

## 2021-08-23 SURGERY — OPEN REDUCTION INTERNAL FIXATION (ORIF) DISTAL RADIUS FRACTURE
Anesthesia: General | Site: Wrist | Laterality: Left

## 2021-08-23 MED ORDER — HYDROMORPHONE HCL 1 MG/ML IJ SOLN
0.2500 mg | INTRAMUSCULAR | Status: DC | PRN
  Administered 2021-08-23: 0.5 mg via INTRAVENOUS

## 2021-08-23 MED ORDER — CELECOXIB 200 MG PO CAPS
ORAL_CAPSULE | ORAL | Status: AC
  Administered 2021-08-23: 200 mg via ORAL
  Filled 2021-08-23: qty 1

## 2021-08-23 MED ORDER — PHENYLEPHRINE HCL (PRESSORS) 10 MG/ML IV SOLN
INTRAVENOUS | Status: AC
  Filled 2021-08-23: qty 1

## 2021-08-23 MED ORDER — EPHEDRINE SULFATE 50 MG/ML IJ SOLN
INTRAMUSCULAR | Status: DC | PRN
  Administered 2021-08-23: 5 mg via INTRAVENOUS

## 2021-08-23 MED ORDER — CEFAZOLIN SODIUM-DEXTROSE 2-4 GM/100ML-% IV SOLN
2.0000 g | INTRAVENOUS | Status: AC
  Administered 2021-08-23: 2 g via INTRAVENOUS

## 2021-08-23 MED ORDER — GLYCOPYRROLATE 0.2 MG/ML IJ SOLN
INTRAMUSCULAR | Status: AC
  Filled 2021-08-23: qty 1

## 2021-08-23 MED ORDER — OXYCODONE HCL 5 MG PO TABS
5.0000 mg | ORAL_TABLET | ORAL | Status: DC | PRN
  Administered 2021-08-23: 5 mg via ORAL

## 2021-08-23 MED ORDER — LIDOCAINE HCL (PF) 2 % IJ SOLN
INTRAMUSCULAR | Status: AC
  Filled 2021-08-23: qty 5

## 2021-08-23 MED ORDER — CHLORHEXIDINE GLUCONATE 0.12 % MT SOLN: 15.0000 mL | Freq: Once | OROMUCOSAL | Status: AC

## 2021-08-23 MED ORDER — DEXAMETHASONE SODIUM PHOSPHATE 10 MG/ML IJ SOLN
INTRAMUSCULAR | Status: DC | PRN
  Administered 2021-08-23: 10 mg via INTRAVENOUS

## 2021-08-23 MED ORDER — ONDANSETRON HCL 4 MG/2ML IJ SOLN: 4.0000 mg | Freq: Once | INTRAMUSCULAR | Status: DC | PRN

## 2021-08-23 MED ORDER — PROPOFOL 10 MG/ML IV BOLUS
INTRAVENOUS | Status: DC | PRN
  Administered 2021-08-23: 140 mg via INTRAVENOUS

## 2021-08-23 MED ORDER — EPHEDRINE 5 MG/ML INJ
INTRAVENOUS | Status: AC
  Filled 2021-08-23: qty 5

## 2021-08-23 MED ORDER — HYDROMORPHONE HCL 1 MG/ML IJ SOLN
INTRAMUSCULAR | Status: AC
  Administered 2021-08-23: 0.5 mg via INTRAVENOUS
  Filled 2021-08-23: qty 1

## 2021-08-23 MED ORDER — FENTANYL CITRATE (PF) 100 MCG/2ML IJ SOLN
INTRAMUSCULAR | Status: AC
  Filled 2021-08-23: qty 2

## 2021-08-23 MED ORDER — CEFAZOLIN SODIUM-DEXTROSE 2-4 GM/100ML-% IV SOLN
INTRAVENOUS | Status: AC
  Filled 2021-08-23: qty 100

## 2021-08-23 MED ORDER — ACETAMINOPHEN 500 MG PO TABS
ORAL_TABLET | ORAL | Status: AC
  Administered 2021-08-23: 1000 mg via ORAL
  Filled 2021-08-23: qty 2

## 2021-08-23 MED ORDER — LIDOCAINE HCL (CARDIAC) PF 100 MG/5ML IV SOSY
PREFILLED_SYRINGE | INTRAVENOUS | Status: DC | PRN
  Administered 2021-08-23: 50 mg via INTRAVENOUS

## 2021-08-23 MED ORDER — OXYCODONE HCL 5 MG PO TABS: 5.0000 mg | ORAL_TABLET | ORAL | 0 refills | Status: DC | PRN

## 2021-08-23 MED ORDER — ORAL CARE MOUTH RINSE: 15.0000 mL | Freq: Once | OROMUCOSAL | Status: AC

## 2021-08-23 MED ORDER — ONDANSETRON HCL 4 MG/2ML IJ SOLN
INTRAMUSCULAR | Status: DC | PRN
Start: 2021-08-23 — End: 2021-08-23
  Administered 2021-08-23: 4 mg via INTRAVENOUS

## 2021-08-23 MED ORDER — MIDAZOLAM HCL 2 MG/2ML IJ SOLN
INTRAMUSCULAR | Status: DC | PRN
  Administered 2021-08-23 (×2): 1 mg via INTRAVENOUS

## 2021-08-23 MED ORDER — CHLORHEXIDINE GLUCONATE 0.12 % MT SOLN
OROMUCOSAL | Status: AC
  Administered 2021-08-23: 15 mL via OROMUCOSAL
  Filled 2021-08-23: qty 15

## 2021-08-23 MED ORDER — ROPIVACAINE HCL 5 MG/ML IJ SOLN
INTRAMUSCULAR | Status: AC
  Filled 2021-08-23: qty 30

## 2021-08-23 MED ORDER — ONDANSETRON HCL 4 MG/2ML IJ SOLN
INTRAMUSCULAR | Status: AC
  Filled 2021-08-23: qty 2

## 2021-08-23 MED ORDER — CELECOXIB 200 MG PO CAPS: 200.0000 mg | ORAL_CAPSULE | ORAL | Status: AC

## 2021-08-23 MED ORDER — ROPIVACAINE HCL 5 MG/ML IJ SOLN
INTRAMUSCULAR | Status: DC | PRN
  Administered 2021-08-23: 30 mL via EPIDURAL

## 2021-08-23 MED ORDER — FENTANYL CITRATE (PF) 100 MCG/2ML IJ SOLN: 25.0000 ug | INTRAMUSCULAR | Status: DC | PRN

## 2021-08-23 MED ORDER — MEPERIDINE HCL 25 MG/ML IJ SOLN: 6.2500 mg | INTRAMUSCULAR | Status: DC | PRN

## 2021-08-23 MED ORDER — HYDROMORPHONE HCL 1 MG/ML IJ SOLN
INTRAMUSCULAR | Status: AC
  Filled 2021-08-23: qty 1

## 2021-08-23 MED ORDER — ACETAMINOPHEN 500 MG PO TABS: 1000.0000 mg | ORAL_TABLET | ORAL | Status: AC

## 2021-08-23 MED ORDER — SODIUM CHLORIDE 0.9 % IR SOLN: Status: DC | PRN

## 2021-08-23 MED ORDER — OXYCODONE HCL 5 MG PO TABS
ORAL_TABLET | ORAL | Status: AC
  Filled 2021-08-23: qty 1

## 2021-08-23 MED ORDER — SEVOFLURANE IN SOLN
RESPIRATORY_TRACT | Status: AC
  Filled 2021-08-23: qty 250

## 2021-08-23 MED ORDER — ONDANSETRON 4 MG PO TBDP: 4.0000 mg | ORAL_TABLET | Freq: Three times a day (TID) | ORAL | 0 refills | Status: DC | PRN

## 2021-08-23 MED ORDER — LACTATED RINGERS IV SOLN: INTRAVENOUS | Status: DC

## 2021-08-23 MED ORDER — PROPOFOL 10 MG/ML IV BOLUS
INTRAVENOUS | Status: AC
  Filled 2021-08-23: qty 20

## 2021-08-23 MED ORDER — HYDROMORPHONE HCL 1 MG/ML IJ SOLN
INTRAMUSCULAR | Status: DC | PRN
  Administered 2021-08-23: .3 mg via INTRAVENOUS
  Administered 2021-08-23: .2 mg via INTRAVENOUS
  Administered 2021-08-23: .3 mg via INTRAVENOUS
  Administered 2021-08-23: .2 mg via INTRAVENOUS

## 2021-08-23 MED ORDER — DEXAMETHASONE SODIUM PHOSPHATE 10 MG/ML IJ SOLN
INTRAMUSCULAR | Status: AC
  Filled 2021-08-23: qty 1

## 2021-08-23 MED ORDER — MIDAZOLAM HCL 2 MG/2ML IJ SOLN
INTRAMUSCULAR | Status: AC
  Filled 2021-08-23: qty 2

## 2021-08-23 MED ORDER — CHLORHEXIDINE GLUCONATE CLOTH 2 % EX PADS: 6.0000 | MEDICATED_PAD | Freq: Once | CUTANEOUS | Status: DC

## 2021-08-23 MED ORDER — BUPIVACAINE HCL (PF) 0.5 % IJ SOLN
INTRAMUSCULAR | Status: AC
  Filled 2021-08-23: qty 30

## 2021-08-23 MED ORDER — FENTANYL CITRATE (PF) 100 MCG/2ML IJ SOLN
INTRAMUSCULAR | Status: DC | PRN
  Administered 2021-08-23 (×2): 25 ug via INTRAVENOUS
  Administered 2021-08-23: 50 ug via INTRAVENOUS

## 2021-08-23 MED ORDER — PHENYLEPHRINE HCL (PRESSORS) 10 MG/ML IV SOLN
INTRAVENOUS | Status: DC | PRN
  Administered 2021-08-23 (×2): 50 ug via INTRAVENOUS
  Administered 2021-08-23: 100 ug via INTRAVENOUS

## 2021-08-23 SURGICAL SUPPLY — 65 items
1.6 K WIRE ×6 IMPLANT
BNDG CMPR STD VLCR NS LF 5.8X3 (GAUZE/BANDAGES/DRESSINGS) ×2
BNDG CMPR STD VLCR NS LF 5.8X4 (GAUZE/BANDAGES/DRESSINGS) ×1
BNDG COHESIVE 4X5 TAN ST LF (GAUZE/BANDAGES/DRESSINGS) ×2 IMPLANT
BNDG ELASTIC 3X5.8 VLCR NS LF (GAUZE/BANDAGES/DRESSINGS) ×4 IMPLANT
BNDG ELASTIC 4X5.8 VLCR NS LF (GAUZE/BANDAGES/DRESSINGS) ×2 IMPLANT
BNDG ELASTIC 4X5.8 VLCR STR LF (GAUZE/BANDAGES/DRESSINGS) ×4 IMPLANT
BNDG ESMARK 4X12 TAN STRL LF (GAUZE/BANDAGES/DRESSINGS) ×2 IMPLANT
BNDG PLASTER FAST 3X3 WHT LF (CAST SUPPLIES) ×4 IMPLANT
BNDG PLSTR 3X3 XFST ST WHT LF (CAST SUPPLIES) ×2
CORD BIP STRL DISP 12FT (MISCELLANEOUS) ×2 IMPLANT
CUFF TOURN SGL QUICK 18X4 (TOURNIQUET CUFF) IMPLANT
DRAPE FLUOR MINI C-ARM 54X84 (DRAPES) ×2 IMPLANT
DRAPE SURG 17X11 SM STRL (DRAPES) ×2 IMPLANT
DRSG GAUZE FLUFF 36X18 (GAUZE/BANDAGES/DRESSINGS) ×2 IMPLANT
DURAPREP 26ML APPLICATOR (WOUND CARE) ×2 IMPLANT
ELECT REM PT RETURN 9FT ADLT (ELECTROSURGICAL) ×2
ELECTRODE REM PT RTRN 9FT ADLT (ELECTROSURGICAL) ×1 IMPLANT
FORCEPS JEWEL BIP 4-3/4 STR (INSTRUMENTS) ×2 IMPLANT
GAUZE 4X4 16PLY ~~LOC~~+RFID DBL (SPONGE) ×2 IMPLANT
GAUZE SPONGE 4X4 12PLY STRL (GAUZE/BANDAGES/DRESSINGS) ×2 IMPLANT
GAUZE XEROFORM 1X8 LF (GAUZE/BANDAGES/DRESSINGS) ×2 IMPLANT
GLOVE SURG ENC MOIS LTX SZ7.5 (GLOVE) ×2 IMPLANT
GLOVE SURG ORTHO LTX SZ9 (GLOVE) ×2 IMPLANT
GLOVE SURG UNDER LTX SZ7.5 (GLOVE) ×2 IMPLANT
GLOVE SURG UNDER POLY LF SZ9 (GLOVE) ×2 IMPLANT
GOWN STRL REUS TWL 2XL XL LVL4 (GOWN DISPOSABLE) ×2 IMPLANT
GOWN STRL REUS W/ TWL LRG LVL3 (GOWN DISPOSABLE) ×1 IMPLANT
GOWN STRL REUS W/TWL LRG LVL3 (GOWN DISPOSABLE) ×2
K-WIRE 1.6 (WIRE) ×6
K-WIRE FX5X1.6XNS BN SS (WIRE) ×3
KIT TURNOVER KIT A (KITS) ×2 IMPLANT
KWIRE FX5X1.6XNS BN SS (WIRE) ×3 IMPLANT
MANIFOLD NEPTUNE II (INSTRUMENTS) ×2 IMPLANT
NEEDLE FILTER BLUNT 18X 1/2SAF (NEEDLE) ×1
NEEDLE FILTER BLUNT 18X1 1/2 (NEEDLE) ×1 IMPLANT
NS IRRIG 500ML POUR BTL (IV SOLUTION) ×2 IMPLANT
PACK EXTREMITY ARMC (MISCELLANEOUS) ×2 IMPLANT
PAD CAST CTTN 4X4 STRL (SOFTGOODS) ×2 IMPLANT
PAD PREP 24X41 OB/GYN DISP (PERSONAL CARE ITEMS) ×2 IMPLANT
PADDING CAST COTTON 4X4 STRL (SOFTGOODS) ×4
PEG LOCKING SMOOTH 2.2X20 (Screw) ×2 IMPLANT
PEG LOCKING SMOOTH 2.2X22 (Screw) ×2 IMPLANT
PLATE NARROW DVR LEFT (Plate) ×2 IMPLANT
SCREW  LP NL 2.7X13MM (Screw) ×1 IMPLANT
SCREW 2.7X12MM (Screw) ×4 IMPLANT
SCREW LOCK 18X2.7X 3 LD TPR (Screw) ×1 IMPLANT
SCREW LOCK 22X2.7X 3 LD TPR (Screw) ×1 IMPLANT
SCREW LOCKING 2.7X18 (Screw) ×2 IMPLANT
SCREW LOCKING 2.7X22MM (Screw) ×2 IMPLANT
SCREW LP NL 2.7X13MM (Screw) ×1 IMPLANT
SCREW NONLOCK 2.7X18MM (Screw) ×2 IMPLANT
SCREW NONLOCK 2.7X20MM (Screw) ×2 IMPLANT
SLING ARM M TX990204 (SOFTGOODS) ×2 IMPLANT
SPLINT CAST 1 STEP 3X12 (MISCELLANEOUS) ×4 IMPLANT
SPLINT CAST 1 STEP 4X30 (MISCELLANEOUS) ×2 IMPLANT
STOCKINETTE STRL 4IN 9604848 (GAUZE/BANDAGES/DRESSINGS) ×2 IMPLANT
STRIP CLOSURE SKIN 1/2X4 (GAUZE/BANDAGES/DRESSINGS) ×2 IMPLANT
SUT MNCRL AB 4-0 PS2 18 (SUTURE) ×2 IMPLANT
SUT VIC AB 0 CT2 27 (SUTURE) ×2 IMPLANT
SUT VIC AB 3-0 SH 27 (SUTURE) ×2
SUT VIC AB 3-0 SH 27X BRD (SUTURE) ×1 IMPLANT
SYR 10ML LL (SYRINGE) ×2 IMPLANT
TAPE TRANSPORE STRL 2 31045 (GAUZE/BANDAGES/DRESSINGS) ×2 IMPLANT
WATER STERILE IRR 500ML POUR (IV SOLUTION) ×2 IMPLANT

## 2021-08-23 NOTE — Transfer of Care (Signed)
Immediate Anesthesia Transfer of Care Note  Patient: Kathryn Powers  Procedure(s) Performed: LEFT OPEN REDUCTION INTERNAL FIXATION (ORIF) DISTAL RADIAL FRACTURE (Left: Wrist)  Patient Location: PACU  Anesthesia Type:General  Level of Consciousness: awake  Airway & Oxygen Therapy: Patient Spontanous Breathing and Patient connected to face mask oxygen  Post-op Assessment: Report given to RN, Post -op Vital signs reviewed and stable and Patient moving all extremities  Post vital signs: Reviewed and stable  Last Vitals:  Vitals Value Taken Time  BP 130/74 08/23/21 1005  Temp 98.6   Pulse 72 08/23/21 1007  Resp 10 08/23/21 1007  SpO2 100 % 08/23/21 1007  Vitals shown include unvalidated device data.  Last Pain:  Vitals:   08/23/21 0653  TempSrc: Temporal  PainSc: 7          Complications: No notable events documented.

## 2021-08-23 NOTE — Anesthesia Procedure Notes (Signed)
Anesthesia Regional Block: Supraclavicular block   Pre-Anesthetic Checklist: , timeout performed,  Correct Patient, Correct Site, Correct Laterality,  Correct Procedure, Correct Position, site marked,  Risks and benefits discussed,  Surgical consent,  Pre-op evaluation,  At surgeon's request and post-op pain management  Laterality: Upper and Left  Prep: chloraprep       Needles:  Injection technique: Single-shot  Needle Type: Echogenic Stimulator Needle     Needle Length: 5cm  Needle Gauge: 22   Needle insertion depth: 3 cm   Additional Needles:   Procedures:, nerve stimulator,,, ultrasound used (permanent image in chart),,     Nerve Stimulator or Paresthesia:  Response: L Forearm, 0.6 mA, 2 ms, 3 cm  Additional Responses:   Narrative:  Start time: 08/23/2021 10:16 AM End time: 08/23/2021 10:28 AM Injection made incrementally with aspirations every 5 mL.  Performed by: Personally  Anesthesiologist: Manfred Arch, MD  Additional Notes: Patient consented for risk and benefits of nerve block including but not limited to nerve damage, failed block, bleeding and infection.  Patient voiced understanding.  Procedure discussed pre-op.  Having significant pain in PACU so elected to proceed.  Functioning IV was confirmed and monitors were applied.  Timeout done prior to procedure and prior to any sedation being given to the patient.  Patient confirmed procedure site prior to any sedation given to the patient.  A 26mm 22ga Stimuplex needle was used. Sterile prep,hand hygiene and sterile gloves were used.  Minimal sedation used for procedure.  No paresthesia endorsed by patient during the procedure.  Negative aspiration and negative test dose prior to incremental administration of local anesthetic. The patient tolerated the procedure well with no immediate complications.  Injectate:  5ml o,5% Ropivacaine

## 2021-08-23 NOTE — Anesthesia Preprocedure Evaluation (Addendum)
Anesthesia Evaluation  Patient identified by MRN, date of birth, ID band Patient awake    Reviewed: Allergy & Precautions, NPO status , Patient's Chart, lab work & pertinent test results  History of Anesthesia Complications (+) history of anesthetic complications  Airway Mallampati: II  TM Distance: >3 FB Neck ROM: Full    Dental no notable dental hx.    Pulmonary neg pulmonary ROS,    Pulmonary exam normal        Cardiovascular hypertension, Pt. on medications Normal cardiovascular exam+ dysrhythmias (Occ Tachycardia, on Beta Blocker)      Neuro/Psych PSYCHIATRIC DISORDERS negative neurological ROS     GI/Hepatic negative GI ROS, Neg liver ROS,   Endo/Other  Hypothyroidism   Renal/GU negative Renal ROS  negative genitourinary   Musculoskeletal negative musculoskeletal ROS (+)   Abdominal   Peds negative pediatric ROS (+)  Hematology negative hematology ROS (+) anemia ,   Anesthesia Other Findings Anemia    Complication of anesthesia  was given Ketamine and had a psychotic outcome  Dysrhythmia    History of kidney stones    Hypertension  pt is not on any meds for HTN  Hypothyroidism       Reproductive/Obstetrics negative OB ROS                            Anesthesia Physical Anesthesia Plan  ASA: 2  Anesthesia Plan: General   Post-op Pain Management:    Induction: Intravenous  PONV Risk Score and Plan: 3 and Midazolam, Propofol infusion and Ondansetron  Airway Management Planned: LMA  Additional Equipment:   Intra-op Plan:   Post-operative Plan: Extubation in OR  Informed Consent: I have reviewed the patients History and Physical, chart, labs and discussed the procedure including the risks, benefits and alternatives for the proposed anesthesia with the patient or authorized representative who has indicated his/her understanding and acceptance.       Plan Discussed  with: CRNA, Anesthesiologist and Surgeon  Anesthesia Plan Comments: (Discussed possible post op brachial plexus block for pain control including risks.)        Anesthesia Quick Evaluation

## 2021-08-23 NOTE — H&P (Addendum)
PREOPERATIVE H&P  Chief Complaint: Left Wrist Colle's Fracture  HPI: Kathryn Powers is a 60 y.o. female who presents for preoperative history and physical with a diagnosis of left distal both bone forearm fracture s/p fall on 08/14/21.  Patient was seen in the [facility] emergency department.  Close reduction was attempted by the ED staff but displacement remained.  Patient was seen in the office and given the displacement of her fracture, it was recommended the patient undergo an open reduction internal fixation.  Past Medical History:  Diagnosis Date   Anemia    Complication of anesthesia    was given Ketamine and had a psychotic outcome   Dysrhythmia    History of kidney stones    Hypertension    pt is not on any meds for HTN   Hypothyroidism    Past Surgical History:  Procedure Laterality Date   ABDOMINAL HYSTERECTOMY     APPENDECTOMY     CHOLECYSTECTOMY     DILATION AND CURETTAGE OF UTERUS     Social History   Socioeconomic History   Marital status: Married    Spouse name: Not on file   Number of children: Not on file   Years of education: Not on file   Highest education level: Not on file  Occupational History   Not on file  Tobacco Use   Smoking status: Never   Smokeless tobacco: Never  Substance and Sexual Activity   Alcohol use: Yes    Alcohol/week: 1.0 standard drink    Types: 1 Glasses of wine per week    Comment: occasional   Drug use: No   Sexual activity: Not on file  Other Topics Concern   Not on file  Social History Narrative   Not on file   Social Determinants of Health   Financial Resource Strain: Not on file  Food Insecurity: Not on file  Transportation Needs: Not on file  Physical Activity: Not on file  Stress: Not on file  Social Connections: Not on file   Family History  Problem Relation Age of Onset   Breast cancer Neg Hx    Allergies  Allergen Reactions   Codeine Nausea And Vomiting   Ketamine     Freaked her out     Prior to Admission medications   Medication Sig Start Date End Date Taking? Authorizing Provider  acetaminophen (TYLENOL) 500 MG tablet Take 500 mg by mouth every 6 (six) hours as needed for moderate pain or headache.   Yes [provider]  atenolol (TENORMIN) 50 MG tablet Take 50 mg by mouth See admin instructions. Take 50 mg daily, may take a second 50 mg dose as needed for palpitations 01/30/16  Yes [provider]  benzonatate (TESSALON) 100 MG capsule Take 100 mg by mouth daily as needed for cough.   Yes [provider]  bismuth subsalicylate (PEPTO BISMOL) 262 MG chewable tablet Chew 262 mg by mouth as needed for indigestion or diarrhea or loose stools.   Yes [provider]  calcium carbonate (TUMS - DOSED IN MG ELEMENTAL CALCIUM) 500 MG chewable tablet Chew 1 tablet by mouth daily as needed for indigestion or heartburn.   Yes [provider]  cyclobenzaprine (FLEXERIL) 5 MG tablet Take 5-10 mg by mouth daily as needed for muscle spasms. 08/31/19  Yes [provider]  diphenhydrAMINE (BENADRYL) 25 MG tablet Take 25 mg by mouth at bedtime as needed for sleep.   Yes [provider]  famotidine (PEPCID)  20 MG tablet Take 20 mg by mouth daily.   Yes [provider]  ibuprofen (ADVIL) 200 MG tablet Take 400 mg by mouth every 6 (six) hours as needed for moderate pain or headache.   Yes [provider]  levothyroxine (SYNTHROID) 112 MCG tablet Take 112 mcg by mouth daily.   Yes [provider]  Multiple Vitamin (MULTI-VITAMIN) tablet Take 1 tablet by mouth daily.   Yes [provider]  ondansetron (ZOFRAN-ODT) 4 MG disintegrating tablet Take 1 tablet (4 mg total) by mouth every 8 (eight) hours as needed. 08/14/21  Yes Fisher, Roselyn Bering, PA-C  oxyCODONE-acetaminophen (PERCOCET) 5-325 MG tablet Take 1 tablet by mouth every 4 (four) hours as needed for severe pain. Patient taking differently: Take 1-2  tablets by mouth every 4 (four) hours as needed for severe pain. 08/14/21 08/14/22 Yes Fisher, Roselyn Bering, PA-C  PARoxetine (PAXIL) 20 MG tablet Take 20 mg by mouth daily. 01/12/18  Yes [provider]  rosuvastatin (CRESTOR) 20 MG tablet Take 20 mg by mouth daily.   Yes [provider]     Positive ROS: All other systems have been reviewed and were otherwise negative with the exception of those mentioned in the HPI and as above.  Physical Exam: General: Alert, no acute distress Cardiovascular: Regular rate and rhythm, no murmurs rubs or gallops.  No pedal edema Respiratory: Clear to auscultation bilaterally, no wheezes rales or rhonchi. No cyanosis, no use of accessory musculature GI: No organomegaly, abdomen is soft and non-tender nondistended with positive bowel sounds. Skin: Skin intact, no lesions within the operative field. Neurologic: Sensation intact distally Psychiatric: Patient is competent for consent with normal mood and affect Lymphatic: No cervical lymphadenopathy  MUSCULOSKELETAL: Left wrist: Patient skin is intact.  There is dorsal angulation to her left wrist.  Patient has active flexion and extension of all digits of the left hand.  Her fingers well-perfused.  She has intact sensation light touch in all digits.  Assessment: Left distal both bone forearm fracture  Plan: Plan for Procedure(s): LEFT OPEN REDUCTION INTERNAL FIXATION (ORIF) DISTAL RADIAL FRACTURE  I reviewed the details of the operation as well as the postoperative course with the patient.  I reviewed the patient's labs and radiographs in preparation for this case.  I discussed the risks and benefits of surgery. The risks include but are not limited to infection, bleeding, nerve or blood vessel injury, joint stiffness or loss of motion, persistent pain, weakness or instability, malunion, nonunion and hardware failure and the need for further surgery. Medical risks include but are not limited to  DVT and pulmonary embolism, myocardial infarction, stroke, pneumonia, respiratory failure and death. Patient understood these risks and wished to proceed.     Juanell Fairly, MD   08/23/2021 7:49 AM

## 2021-08-23 NOTE — Anesthesia Postprocedure Evaluation (Signed)
Anesthesia Post Note  Patient: Stevphen Rochester  Procedure(s) Performed: LEFT OPEN REDUCTION INTERNAL FIXATION (ORIF) DISTAL RADIAL FRACTURE (Left: Wrist)  Patient location during evaluation: PACU Anesthesia Type: General Level of consciousness: awake and alert, awake and oriented Pain management: pain level controlled Vital Signs Assessment: post-procedure vital signs reviewed and stable Respiratory status: spontaneous breathing, nonlabored ventilation and respiratory function stable Cardiovascular status: blood pressure returned to baseline and stable Postop Assessment: no apparent nausea or vomiting Anesthetic complications: no   No notable events documented.   Last Vitals:  Vitals:   08/23/21 1115 08/23/21 1127  BP: 118/74 134/68  Pulse: 74 74  Resp: 12 16  Temp: 36.7 C (!) 36.1 C  SpO2: 95% 97%    Last Pain:  Vitals:   08/23/21 1127  TempSrc: Temporal  PainSc: 7                  Manfred Arch

## 2021-08-23 NOTE — Op Note (Addendum)
08/23/2021  10:41 AM  PATIENT:  Kathryn Powers    PRE-OPERATIVE DIAGNOSIS:  Left distal both bone forearm fracture  POST-OPERATIVE DIAGNOSIS:  Same  PROCEDURE:  LEFT OPEN REDUCTION INTERNAL FIXATION (ORIF) DISTAL RADIUS FRACTURE  SURGEON:  Juanell Fairly, MD  ANESTHESIA:   General  Tourniquet time:  90 minutes  PREOPERATIVE INDICATIONS:  Kathryn Powers is a  60 y.o. female with a diagnosis of Left distal both bone forearm fracture.  Given the patient's displacement of her fractures she was recommended for an open reduction internal fixation.  Patient had persistent dorsal angulation of her fractures despite closed reduction and splinting in the ER.   The risks benefits and alternatives were discussed with the patient preoperatively including but not limited to the risks of infection, bleeding, nerve injury, malunion, nonunion, wrist stiffness, persistent wrist pain, osteoarthritis and the need for further surgery. Medical risks include but are not limited to DVT and pulmonary embolism, myocardial infarction, stroke, pneumonia, respiratory failure and death. Patient and her husband understood these risks and wished to proceed.   OPERATIVE IMPLANTS: [employer] hand innovations plate  OPERATIVE FINDINGS: Displaced fracture of the distal radius and ulna  OPERATIVE PROCEDURE: Patient was seen in the preoperative area. I marked the left hand with the word yes and my initials according the hospital's correct site of surgery protocol. I answered all questions by the patient. Patient was then brought to the operating room where the patient was placed supine on the operative table.  She underwent general anesthesia.   The left arm was prepped and draped in a sterile fashion. A timeout performed to verify the patient's name, date of birth, medical record number, correct site of surgery correct procedure to be performed. The timeout was also used a timeout to verify patient received antibiotics and  appropriate instruments, implants and radiographs studies were available in the room. Once all in attendance were in agreement case began.   Patient then had the operative extremity exsanguinated with an Esmarch. The tourniquet was placed on the left upper extremity and inflated 250 mm for a total of 90 minutes.  A manual reduction of the fracture was performed. The fracture reduction was confirmed on FluoroScan imaging.  A linear incision was then made over the FCR tendon. The subcutaneous tissue was carefully dissected using Metzenbaum scissor and Adson pickup.  Median nerve was identified as it was very superficial and just ulnar to the FCR tendon.  Retractors were used to protect the radial artery and median nerve. The pronator quadratus was identified and incised and elevated off the volar surface of the distal radius. A 3-hole [employer] volar plate was then positioned on the under surface of the distal radius. It was held into position with 3 K wires. The position of the plate was confirmed on AP and lateral images. Once the plate was in good position two shaft distal fully threaded bicortical screws were placed.  Their position was confirmed on FluoroScan imaging.  Each individual peg hole was drilled and then measured with a depth gauge. A third bicortical fully threaded screw was placed in the proximal row.  Again its position was confirmed on FluoroScan imaging to ensure that I had not penetrated into the wrist joint.  A bicortical shaft screw was then placed to reduce the plate to the distal radius.  The remaining proximal row holes were filled with smooth pegs.  A fully threaded bicortical screw was then placed into the radial styloid.  The position of all  distal pegs and screws was confirmed on FluoroScan imaging.  No screw was found to penetrate into the wrist joint.  2 additional bicortical shaft screws were then placed through the plate.  The position and length of all screws were confirmed  on AP and lateral FluoroScan imaging.   Once the distal radius was completely fixed final FluoroScan images were taken.  The ulna was carefully examined and found to be well reduced and did not need additional fixation.  The wound was copiously irrigated. The soft tissue was then carefully replaced over the plate. The skin was closed with running 4-0 monocryl. Xeroform and a dry sterile dressing were applied along with an AP splint. I was scrubbed and present for the entire case and all sharp and instrument counts were correct at the conclusion the case. The patient tolerated this procedure well. I spoke with the patient's husband in the postop consultation room to let him know the case had gone without complication and the patient was stable in recovery room.   Kathryn Devoid, MD

## 2021-08-23 NOTE — Discharge Instructions (Addendum)

## 2022-03-15 IMAGING — RF DG C-ARM 1-60 MIN
4 series · 4 of 4 positions shown · non-contrast
Comparison: 08/14/2021

CLINICAL DATA: Status post ORIF, left wrist

EXAM:
LEFT WRIST - 2 VIEW; DG C-ARM 1-60 MIN

[Series 120: run · 1 of 1 slices shown (1 of 4)]
[im 1/1]
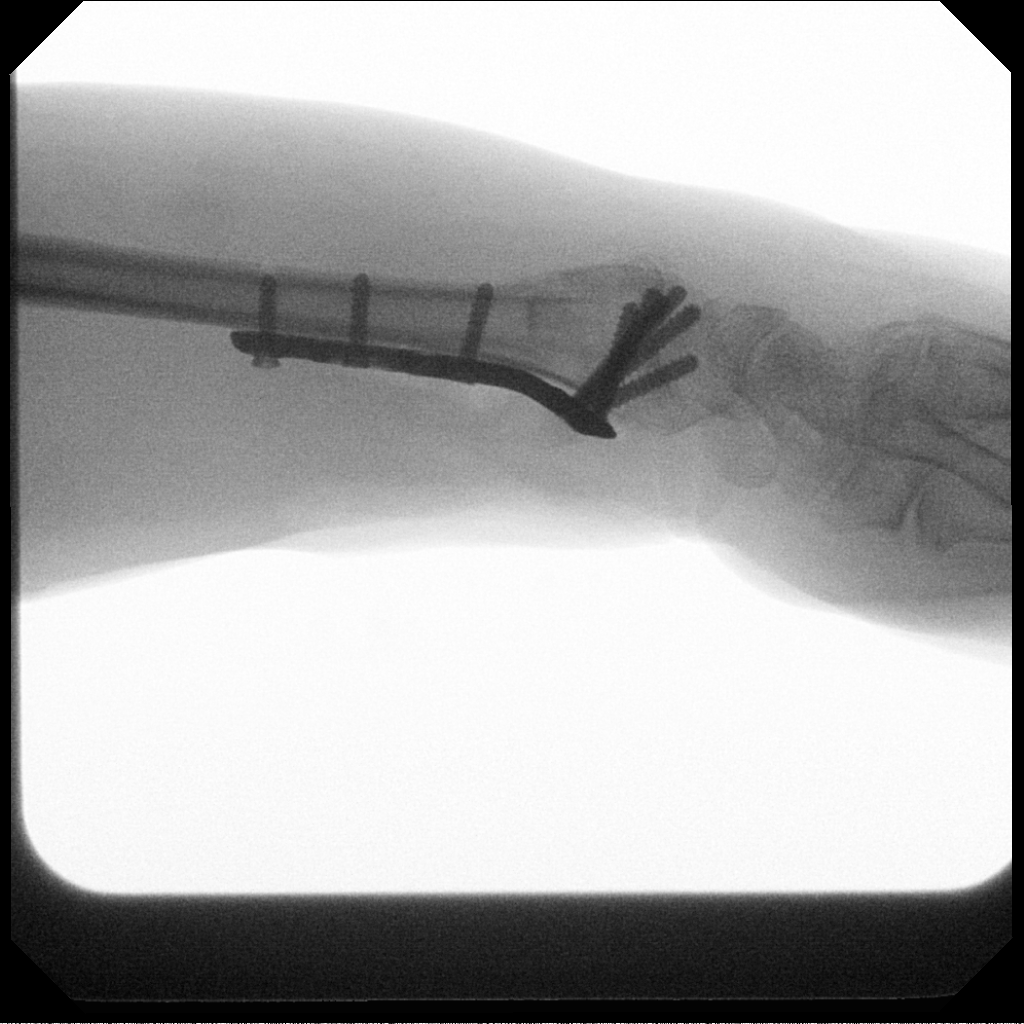

[Series 121: run · 1 of 1 slices shown (2 of 4)]
[im 1/1]
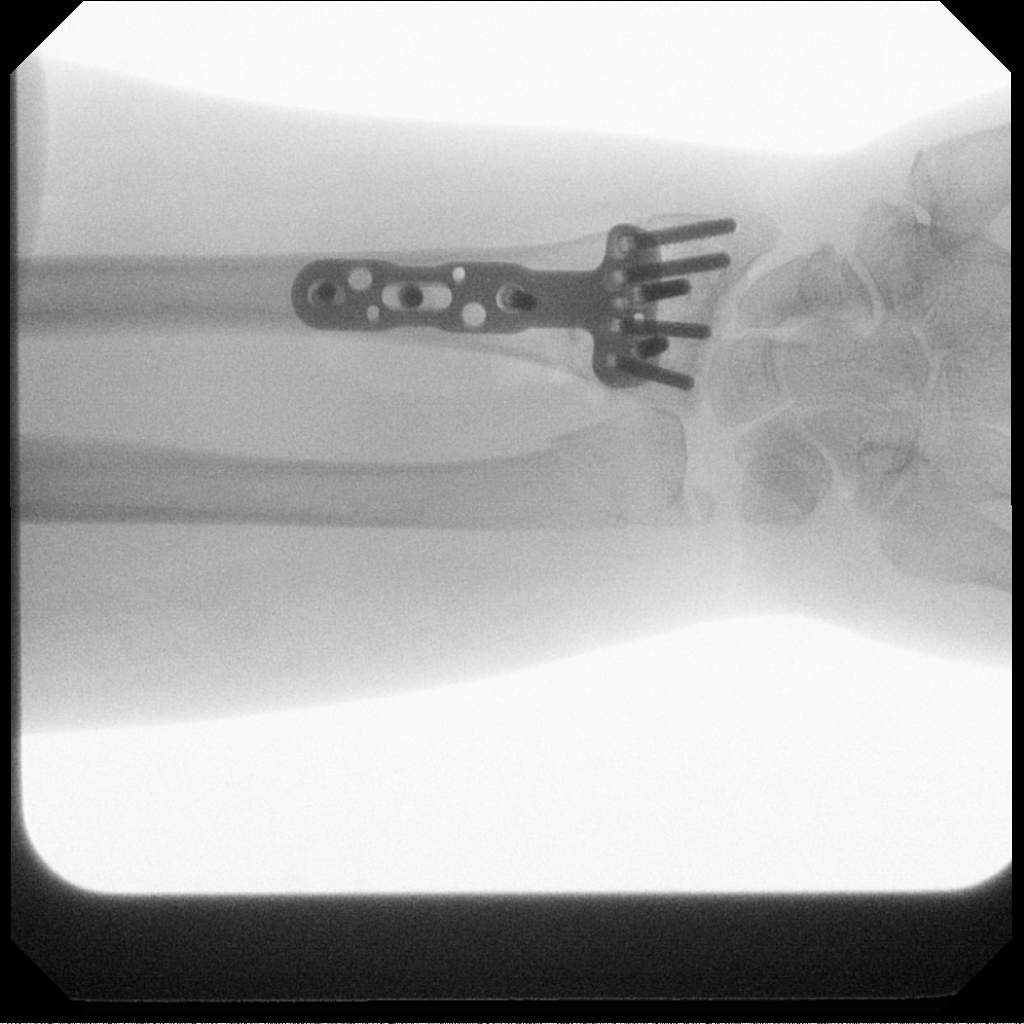

[Series 122: run · 1 of 1 slices shown (3 of 4)]
[im 1/1]
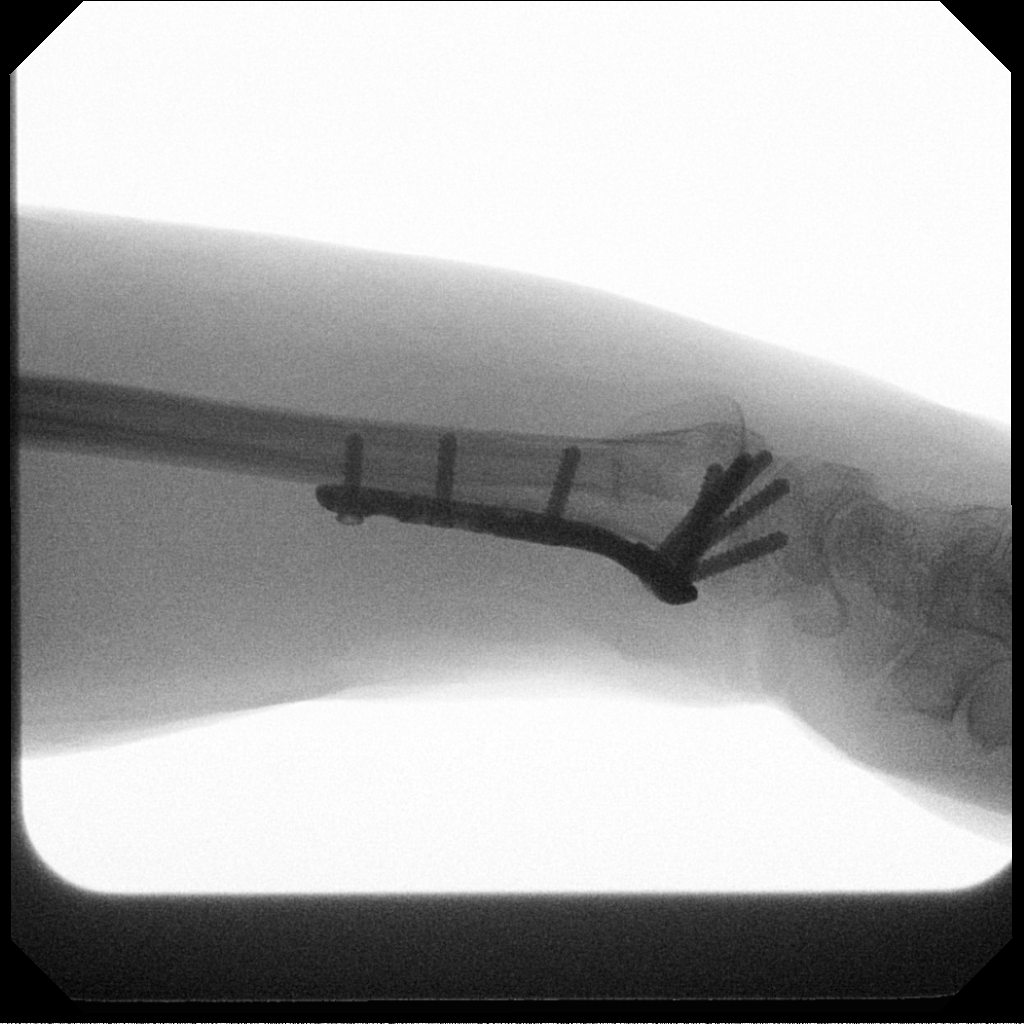

[Series 123: run · 1 of 1 slices shown (4 of 4)]
[im 1/1]
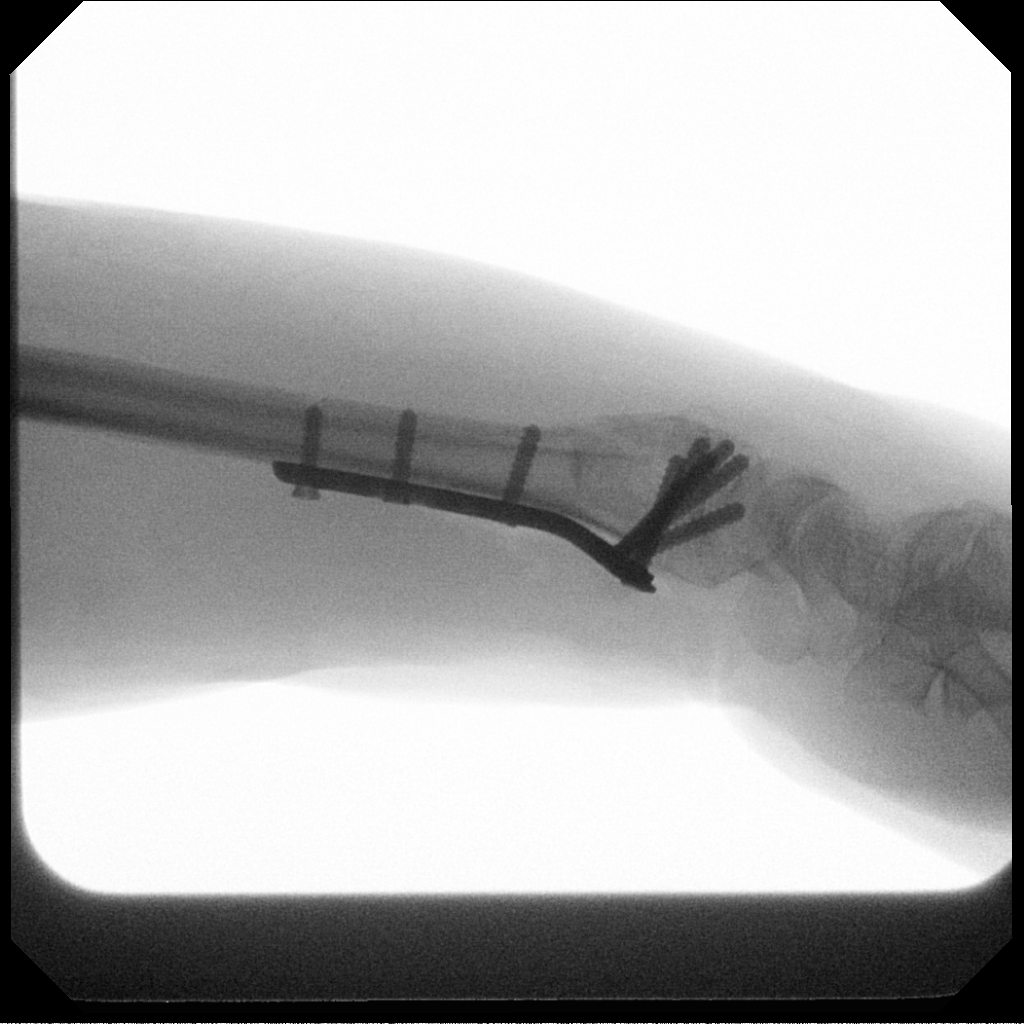

[4 of 4 positions shown; findings below may reference images not displayed]

FINDINGS: Intraoperative fluoroscopic images of the left wrist demonstrate
plate and screw fixation of the volar distal radius.

Postoperative radiographs demonstrate satisfactory appearance of
hardware. No evidence of perihardware fracture or component
malpositioning. Persistent, mildly displaced fractures of the distal
left ulnar metadiaphysis and ulnar styloid. Cast material about the
wrist. Diffuse soft tissue edema.
IMPRESSION: 1. Intraoperative fluoroscopic images of the left wrist demonstrate
plate and screw fixation of the volar distal radius.

2. Postoperative radiographs demonstrate satisfactory appearance of
hardware. No evidence of perihardware fracture or component
malpositioning.

## 2022-09-13 ENCOUNTER — Other Ambulatory Visit: Payer: Self-pay | Admitting: Registered Nurse

## 2022-09-13 DIAGNOSIS — Z1231 Encounter for screening mammogram for malignant neoplasm of breast: Secondary | ICD-10-CM

## 2023-03-26 ENCOUNTER — Other Ambulatory Visit: Payer: Self-pay

## 2023-03-26 ENCOUNTER — Encounter: Payer: Self-pay | Admitting: Emergency Medicine

## 2023-03-26 ENCOUNTER — Ambulatory Visit
Admission: EM | Admit: 2023-03-26 | Discharge: 2023-03-26 | Disposition: A | Discharge status: Home / Self Care | Payer: Managed Care, Other (non HMO) | Attending: Emergency Medicine | Admitting: Emergency Medicine

## 2023-03-26 DIAGNOSIS — H60501 Unspecified acute noninfective otitis externa, right ear: Secondary | ICD-10-CM

## 2023-03-26 MED ORDER — OFLOXACIN 0.3 % OT SOLN: 10.0000 [drp] | Freq: Every day | OTIC | 0 refills | Status: DC

## 2023-03-26 NOTE — Discharge Instructions (Addendum)
Use the ear drops as directed.  Follow up with your primary care provider if your symptoms are not improving.    

## 2023-03-26 NOTE — ED Triage Notes (Addendum)
Patient presents to [facility] for evaluation of right ear pain.  Patient is currently on Amoxicillin, and was improving, and never had significant pain.  Last night she woke up to a lot of drainage on her pillow, and said the ear hurts more than ever did.  She also noticed what looked like some fever blisters to her in her ear,as well as some numbness around the outside of the ear.

## 2023-03-26 NOTE — ED Provider Notes (Signed)
Renaldo Fiddler    CSN: [id number] Arrival date & time: 03/26/23  1535      History   Chief Complaint Chief Complaint  Patient presents with   Ear Drainage    Entered by patient   Ear Pain    HPI Kathryn Powers is a 62 y.o. female.  Patient present with right ear pain and drainage x 1 week.  She did a e-visit last week and was prescribed amoxicillin; she has two tablets left to take.  She was improving but the ear pain increased yesterday and she had ear drainage overnight.  She denies fever, chills, rash, sore throat, cough, shortness of breath, or other symptoms.  Her medical history includes hypertension.   The history is provided by the patient and medical records.    Past Medical History:  Diagnosis Date   Anemia    Complication of anesthesia    was given Ketamine and had a psychotic outcome   Dysrhythmia    History of kidney stones    Hypertension    pt is not on any meds for HTN   Hypothyroidism     Patient Active Problem List   Diagnosis Date Noted   Chronic pain of left knee 10/27/2018   Adjustment disorder with mixed anxiety and depressed mood 09/11/2017   Increased MCV 09/11/2017   Pure hypercholesterolemia 08/22/2016   Acquired hypothyroidism 04/05/2015   Bilateral hearing loss 04/05/2015   Chronic neck pain 04/05/2015   Vitamin D deficiency 04/05/2015    Past Surgical History:  Procedure Laterality Date   ABDOMINAL HYSTERECTOMY     APPENDECTOMY     CHOLECYSTECTOMY     DILATION AND CURETTAGE OF UTERUS     OPEN REDUCTION INTERNAL FIXATION (ORIF) DISTAL RADIAL FRACTURE Left 08/23/2021   Procedure: LEFT OPEN REDUCTION INTERNAL FIXATION (ORIF) DISTAL RADIAL FRACTURE;  Surgeon: Juanell Fairly, MD;  Location: [facility] ORS;  Service: Orthopedics;  Laterality: Left;    OB History   No obstetric history on file.      Home Medications    Prior to Admission medications   Medication Sig Start Date End Date Taking? Authorizing Provider  ofloxacin  (FLOXIN) 0.3 % OTIC solution Place 10 drops into the right ear daily. 03/26/23  Yes Mickie Bail, NP  acetaminophen (TYLENOL) 500 MG tablet Take 500 mg by mouth every 6 (six) hours as needed for moderate pain or headache.    [provider]  atenolol (TENORMIN) 50 MG tablet Take 50 mg by mouth See admin instructions. Take 50 mg daily, may take a second 50 mg dose as needed for palpitations 01/30/16   [provider]  benzonatate (TESSALON) 100 MG capsule Take 100 mg by mouth daily as needed for cough.    [provider]  bismuth subsalicylate (PEPTO BISMOL) 262 MG chewable tablet Chew 262 mg by mouth as needed for indigestion or diarrhea or loose stools.    [provider]  calcium carbonate (TUMS - DOSED IN MG ELEMENTAL CALCIUM) 500 MG chewable tablet Chew 1 tablet by mouth daily as needed for indigestion or heartburn.    [provider]  cyclobenzaprine (FLEXERIL) 5 MG tablet Take 5-10 mg by mouth daily as needed for muscle spasms. 08/31/19   [provider]  diphenhydrAMINE (BENADRYL) 25 MG tablet Take 25 mg by mouth at bedtime as needed for sleep.    [provider]  famotidine (PEPCID) 20 MG tablet Take 20 mg by mouth daily.    [provider]  ibuprofen (ADVIL) 200 MG tablet Take 400 mg by mouth every 6 (six) hours as needed for moderate pain or headache.    [provider]  levothyroxine (SYNTHROID) 112 MCG tablet Take 112 mcg by mouth daily.    [provider]  Multiple Vitamin (MULTI-VITAMIN) tablet Take 1 tablet by mouth daily.    [provider]  ondansetron (ZOFRAN-ODT) 4 MG disintegrating tablet Take 1 tablet (4 mg total) by mouth every 8 (eight) hours as needed. 08/23/21   Juanell FairlyKrasinski, Kevin, MD  oxyCODONE (OXY IR/ROXICODONE) 5 MG immediate release tablet Take 1 tablet (5 mg total) by mouth every 4 (four) hours as needed. 08/23/21   Juanell FairlyKrasinski, Kevin, MD  PARoxetine (PAXIL) 20 MG tablet Take 20 mg by  mouth daily. 01/12/18   [provider]  rosuvastatin (CRESTOR) 20 MG tablet Take 20 mg by mouth daily.    [provider]    Family History Family History  Problem Relation Age of Onset   Breast cancer Neg Hx     Social History Social History   Tobacco Use   Smoking status: Never   Smokeless tobacco: Never  Substance Use Topics   Alcohol use: Yes    Alcohol/week: 1.0 standard drink of alcohol    Types: 1 Glasses of wine per week    Comment: occasional   Drug use: No     Allergies   Codeine and Ketamine   Review of Systems Review of Systems  Constitutional:  Negative for chills and fever.  HENT:  Positive for ear discharge and ear pain. Negative for sore throat.   Respiratory:  Negative for cough and shortness of breath.   Cardiovascular:  Negative for chest pain and palpitations.  Gastrointestinal:  Negative for diarrhea and vomiting.  All other systems reviewed and are negative.    Physical Exam Triage Vital Signs ED Triage Vitals  Enc Vitals Group     BP      Pulse      Resp      Temp      Temp src      SpO2      Weight      Height      Head Circumference      Peak Flow      Pain Score      Pain Loc      Pain Edu?      Excl. in GC?    No data found.  Updated Vital Signs BP 126/84 (BP Location: Right Arm)   Pulse 87   Temp 97.9 F (36.6 C)   Resp 18   SpO2 97%   Visual Acuity Right Eye Distance:   Left Eye Distance:   Bilateral Distance:    Right Eye Near:   Left Eye Near:    Bilateral Near:     Physical Exam Vitals and nursing note reviewed.  Constitutional:      General: She is not in acute distress.    Appearance: Normal appearance. She is well-developed. She is not ill-appearing.  HENT:     Head: Normocephalic and atraumatic.     Right Ear: Tympanic membrane normal. Drainage present.     Left Ear: Tympanic membrane and ear canal normal.     Ears:     Comments: Right ear tender with movement.  Moderate  purulent drainage in canal.     Nose: Nose normal.     Mouth/Throat:     Mouth: Mucous membranes are moist.  Pharynx: Oropharynx is clear.  Cardiovascular:     Rate and Rhythm: Normal rate and regular rhythm.     Heart sounds: Normal heart sounds.  Pulmonary:     Effort: Pulmonary effort is normal. No respiratory distress.     Breath sounds: Normal breath sounds.  Musculoskeletal:     Cervical back: Neck supple.  Skin:    General: Skin is warm and dry.  Neurological:     Mental Status: She is alert.  Psychiatric:        Mood and Affect: Mood normal.        Behavior: Behavior normal.      UC Treatments / Results  Labs (all labs ordered are listed, but only abnormal results are displayed) Labs Reviewed - No data to display  EKG   Radiology No results found.  Procedures Procedures (including critical care time)  Medications Ordered in UC Medications - No data to display  Initial Impression / Assessment and Plan / UC Course  I have reviewed the triage vital signs and the nursing notes.  Pertinent labs & imaging results that were available during my care of the patient were reviewed by me and considered in my medical decision making (see chart for details).    Acute otitis externa.  Treating with ofloxacin eardrops.  Instructed patient to complete the amoxicillin as prescribed.  Tylenol or ibuprofen as needed.  Instructed patient to follow up with her PCP if her symptoms are not improving.  Education provided on otitis externa.  She agrees to plan of care.    Final Clinical Impressions(s) / UC Diagnoses   Final diagnoses:  Acute otitis externa of right ear, unspecified type     Discharge Instructions      Use the ear drops as directed.  Follow up with your primary care provider if your symptoms are not improving.        ED Prescriptions     Medication Sig Dispense Auth. Provider   ofloxacin (FLOXIN) 0.3 % OTIC solution Place 10 drops into the right  ear daily. 5 mL Mickie Bail, NP      PDMP not reviewed this encounter.   Mickie Bail, NP 03/26/23 1606

## 2023-05-14 ENCOUNTER — Other Ambulatory Visit: Payer: Self-pay | Admitting: Obstetrics and Gynecology

## 2023-05-14 DIAGNOSIS — Z1231 Encounter for screening mammogram for malignant neoplasm of breast: Secondary | ICD-10-CM

## 2023-10-02 ENCOUNTER — Telehealth: Payer: Self-pay

## 2023-10-02 ENCOUNTER — Ambulatory Visit
Admission: RE | Admit: 2023-10-02 | Discharge: 2023-10-02 | Disposition: A | Discharge status: Home / Self Care | Payer: Managed Care, Other (non HMO) | Source: Ambulatory Visit

## 2023-10-02 VITALS — BP 142/86 | HR 81 | Temp 98.7°F | Resp 18

## 2023-10-02 DIAGNOSIS — B349 Viral infection, unspecified: Secondary | ICD-10-CM

## 2023-10-02 MED ORDER — ALBUTEROL SULFATE HFA 108 (90 BASE) MCG/ACT IN AERS: 2.0000 | INHALATION_SPRAY | RESPIRATORY_TRACT | 0 refills | Status: DC | PRN

## 2023-10-02 MED ORDER — PREDNISONE 10 MG (21) PO TBPK: ORAL_TABLET | Freq: Every day | ORAL | 0 refills | Status: DC

## 2023-10-02 MED ORDER — IPRATROPIUM-ALBUTEROL 0.5-2.5 (3) MG/3ML IN SOLN
3.0000 mL | Freq: Once | RESPIRATORY_TRACT | Status: AC
  Administered 2023-10-02: 3 mL via RESPIRATORY_TRACT

## 2023-10-02 MED ORDER — DEXAMETHASONE SODIUM PHOSPHATE 10 MG/ML IJ SOLN
10.0000 mg | Freq: Once | INTRAMUSCULAR | Status: AC
  Administered 2023-10-02: 10 mg via INTRAMUSCULAR

## 2023-10-02 MED ORDER — PROMETHAZINE-DM 6.25-15 MG/5ML PO SYRP: 5.0000 mL | ORAL_SOLUTION | Freq: Four times a day (QID) | ORAL | 0 refills | Status: DC | PRN

## 2023-10-02 MED ORDER — BENZONATATE 200 MG PO CAPS: 200.0000 mg | ORAL_CAPSULE | Freq: Three times a day (TID) | ORAL | 0 refills | Status: DC | PRN

## 2023-10-02 NOTE — Discharge Instructions (Addendum)
You were evaluated for your persistent cough most likely caused by RSV which is a viral respiratory illness which must resolve with time, this virus can cause inflammation within the airway which is most likely current  You have been given an injection of steroids and nebulizer treatment here in the clinic to help calm coughing  On exam lungs are clear, low suspicion for pneumonia or bronchitis at this time  Starting tomorrow take prednisone every morning with food as directed to further open and relax the airway to settle shortness of breath wheezing and harsh coughing  You may continue use of Tessalon pill as needed  May use cough syrup at bedtime as needed to help you rest, may use throughout the day but please be mindful make you drowsy  May take 2 puffs of albuterol inhaler every 6 hours as needed for coughing, shortness of breath or wheezing  Use humidifier at nighttime to help keep airways moist , you do not have a humidifier you may steam up the bathroom and sit inside for 10 to 15 minutes prior to bed, also helps to do first thing in the morning to loosen up mucus   For cough: honey 1/2 to 1 teaspoon (you can dilute the honey in water or another fluid).  You can also use guaifenesin and dextromethorphan for cough. You can use a humidifier for chest congestion and cough.  If you don't have a humidifier, you can sit in the bathroom with the hot shower running.      For sore throat: try warm salt water gargles, cepacol lozenges, throat spray, warm tea or water with lemon/honey, popsicles or ice, or OTC cold relief medicine for throat discomfort.   For congestion: take a daily anti-histamine like Zyrtec, Claritin, and a oral decongestant, such as pseudoephedrine.  You can also use Flonase 1-2 sprays in each nostril daily.   It is important to stay hydrated: drink plenty of fluids (water, gatorade/powerade/pedialyte, juices, or teas) to keep your throat moisturized and help further relieve  irritation/discomfort.

## 2023-10-02 NOTE — ED Provider Notes (Signed)
Renaldo Fiddler    CSN: [id number] Arrival date & time: 10/02/23  1059      History   Chief Complaint Chief Complaint  Patient presents with   Cough    Entered by patient    HPI Kathryn Powers is a 62 y.o. female.   Patient presents for evaluation of nasal congestion, rhinorrhea, and a harsh and persistent productive cough, a constant headache and a severe sore throat present for 4 days.  Symptoms began after exposure to RSV within household.  Completed a virtual televisit, recommended to take ibuprofen 800 mg and Tessalon which has provided no relief.  interfering with sleep, experiencing intermittent shortness of breath and wheezing.  Warmer smoker.  Denies respiratory history.  Past Medical History:  Diagnosis Date   Anemia    Complication of anesthesia    was given Ketamine and had a psychotic outcome   Dysrhythmia    History of kidney stones    Hypertension    pt is not on any meds for HTN   Hypothyroidism     Patient Active Problem List   Diagnosis Date Noted   Chronic pain of left knee 10/27/2018   Adjustment disorder with mixed anxiety and depressed mood 09/11/2017   Increased MCV 09/11/2017   Pure hypercholesterolemia 08/22/2016   Acquired hypothyroidism 04/05/2015   Bilateral hearing loss 04/05/2015   Chronic neck pain 04/05/2015   Vitamin D deficiency 04/05/2015    Past Surgical History:  Procedure Laterality Date   ABDOMINAL HYSTERECTOMY     APPENDECTOMY     CHOLECYSTECTOMY     DILATION AND CURETTAGE OF UTERUS     OPEN REDUCTION INTERNAL FIXATION (ORIF) DISTAL RADIAL FRACTURE Left 08/23/2021   Procedure: LEFT OPEN REDUCTION INTERNAL FIXATION (ORIF) DISTAL RADIAL FRACTURE;  Surgeon: Juanell Fairly, MD;  Location: [facility] ORS;  Service: Orthopedics;  Laterality: Left;    OB History   No obstetric history on file.      Home Medications    Prior to Admission medications   Medication Sig Start Date End Date Taking? Authorizing Provider   acetaminophen (TYLENOL) 500 MG tablet Take 500 mg by mouth every 6 (six) hours as needed for moderate pain or headache.    [provider]  atenolol (TENORMIN) 50 MG tablet Take 50 mg by mouth See admin instructions. Take 50 mg daily, may take a second 50 mg dose as needed for palpitations 01/30/16   [provider]  benzonatate (TESSALON) 100 MG capsule Take 100 mg by mouth daily as needed for cough. Patient not taking: Reported on 10/02/2023    [provider]  benzonatate (TESSALON) 200 MG capsule Take 200 mg by mouth 3 (three) times daily as needed.    [provider]  bismuth subsalicylate (PEPTO BISMOL) 262 MG chewable tablet Chew 262 mg by mouth as needed for indigestion or diarrhea or loose stools.    [provider]  calcium carbonate (TUMS - DOSED IN MG ELEMENTAL CALCIUM) 500 MG chewable tablet Chew 1 tablet by mouth daily as needed for indigestion or heartburn.    [provider]  cyclobenzaprine (FLEXERIL) 5 MG tablet Take 5-10 mg by mouth daily as needed for muscle spasms. 08/31/19   [provider]  diphenhydrAMINE (BENADRYL) 25 MG tablet Take 25 mg by mouth at bedtime as needed for sleep.    [provider]  famotidine (PEPCID) 20 MG tablet Take 20 mg by mouth daily.    [provider]  ibuprofen (ADVIL) 200  MG tablet Take 400 mg by mouth every 6 (six) hours as needed for moderate pain or headache.    [provider]  levothyroxine (SYNTHROID) 112 MCG tablet Take 112 mcg by mouth daily.    [provider]  Multiple Vitamin (MULTI-VITAMIN) tablet Take 1 tablet by mouth daily.    [provider]  ofloxacin (FLOXIN) 0.3 % OTIC solution Place 10 drops into the right ear daily. 03/26/23   Mickie Bail, NP  ondansetron (ZOFRAN-ODT) 4 MG disintegrating tablet Take 1 tablet (4 mg total) by mouth every 8 (eight) hours as needed. 08/23/21   Juanell Fairly, MD  oxyCODONE (OXY IR/ROXICODONE)  5 MG immediate release tablet Take 1 tablet (5 mg total) by mouth every 4 (four) hours as needed. Patient not taking: Reported on 10/02/2023 08/23/21   Juanell Fairly, MD  PARoxetine (PAXIL) 20 MG tablet Take 20 mg by mouth daily. 01/12/18   [provider]  rosuvastatin (CRESTOR) 20 MG tablet Take 20 mg by mouth daily.    [provider]    Family History Family History  Problem Relation Age of Onset   Breast cancer Neg Hx     Social History Social History   Tobacco Use   Smoking status: Never   Smokeless tobacco: Never  Substance Use Topics   Alcohol use: Yes    Alcohol/week: 1.0 standard drink of alcohol    Types: 1 Glasses of wine per week    Comment: occasional   Drug use: No     Allergies   Codeine and Ketamine   Review of Systems Review of Systems   Physical Exam Triage Vital Signs ED Triage Vitals  Encounter Vitals Group     BP 10/02/23 1121 (!) 142/86     Systolic BP Percentile --      Diastolic BP Percentile --      Pulse Rate 10/02/23 1121 81     Resp 10/02/23 1121 18     Temp 10/02/23 1121 98.7 F (37.1 C)     Temp Source 10/02/23 1121 Oral     SpO2 10/02/23 1121 96 %     Weight --      Height --      Head Circumference --      Peak Flow --      Pain Score 10/02/23 1124 5     Pain Loc --      Pain Education --      Exclude from Growth Chart --    No data found.  Updated Vital Signs BP (!) 142/86 (BP Location: Left Arm)   Pulse 81   Temp 98.7 F (37.1 C) (Oral)   Resp 18   SpO2 96%   Visual Acuity Right Eye Distance:   Left Eye Distance:   Bilateral Distance:    Right Eye Near:   Left Eye Near:    Bilateral Near:     Physical Exam Constitutional:      Appearance: Normal appearance.  HENT:     Head: Normocephalic.     Right Ear: Tympanic membrane, ear canal and external ear normal.     Left Ear: Tympanic membrane, ear canal and external ear normal.     Nose: Congestion present. No rhinorrhea.      Mouth/Throat:     Pharynx: Posterior oropharyngeal erythema present. No oropharyngeal exudate.  Cardiovascular:     Rate and Rhythm: Normal rate and regular rhythm.     Pulses: Normal pulses.  Heart sounds: Normal heart sounds.  Pulmonary:     Breath sounds: Normal breath sounds.     Comments: Labored breathing, persistent harsh coughing, difficulty catching breath due to cough Musculoskeletal:     Cervical back: Normal range of motion and neck supple.  Neurological:     Mental Status: She is alert and oriented to person, place, and time. Mental status is at baseline.      UC Treatments / Results  Labs (all labs ordered are listed, but only abnormal results are displayed) Labs Reviewed - No data to display  EKG   Radiology No results found.  Procedures Procedures (including critical care time)  Medications Ordered in UC Medications  dexamethasone (DECADRON) injection 10 mg (has no administration in time range)  ipratropium-albuterol (DUONEB) 0.5-2.5 (3) MG/3ML nebulizer solution 3 mL (has no administration in time range)    Initial Impression / Assessment and Plan / UC Course  I have reviewed the triage vital signs and the nursing notes.  Pertinent labs & imaging results that were available during my care of the patient were reviewed by me and considered in my medical decision making (see chart for details).  Viral Illness  Vitals are stable, O2 saturation 96% on room air and lungs are clear to auscultation, low suspicion for pneumonia or bronchitis therefore imaging deferred at this time, patient has harsh persistent coughing, struggling to form sentences to catch her breath, has been like this since checking in for visit, DuoNeb and Decadron IM given, on reevaluation persistent cough has begun to subside, patient endorses feeling better, now stable for outpatient management, prescribed prednisone taper, albuterol, refilled Tessalon and prescribed Promethazine DM,  discussed additional supportive measures and advised follow-up for any persisting or worsening symptoms Final Clinical Impressions(s) / UC Diagnoses   Final diagnoses:  None   Discharge Instructions   None    ED Prescriptions   None    PDMP not reviewed this encounter.   Valinda Hoar, NP 10/02/23 1210

## 2023-10-02 NOTE — ED Triage Notes (Signed)
Patient to Urgent Care with complaints of headaches, fatigue, cough that started four days ago.  Taking 800mg  ibuprofen and using tessalon prescribed via MD live. Told she has RSV.

## 2023-10-06 NOTE — Plan of Care (Signed)
[facility] Tonsillectomy/Adenoidectomy, Postoperative PEDS care plan entered in error.

## 2023-10-21 ENCOUNTER — Ambulatory Visit
Admission: EM | Admit: 2023-10-21 | Discharge: 2023-10-21 | Disposition: A | Discharge status: Home / Self Care | Payer: Managed Care, Other (non HMO)

## 2023-10-21 DIAGNOSIS — B029 Zoster without complications: Secondary | ICD-10-CM

## 2023-10-21 MED ORDER — GABAPENTIN 100 MG PO CAPS: 100.0000 mg | ORAL_CAPSULE | Freq: Every day | ORAL | 0 refills | Status: DC

## 2023-10-21 MED ORDER — PREDNISONE 20 MG PO TABS: 40.0000 mg | ORAL_TABLET | Freq: Every day | ORAL | 0 refills | Status: DC

## 2023-10-21 MED ORDER — ACETAMINOPHEN-CODEINE 300-30 MG PO TABS
1.0000 | ORAL_TABLET | Freq: Four times a day (QID) | ORAL | 0 refills | Status: AC | PRN
Start: 2023-10-21 — End: ?

## 2023-10-21 NOTE — Discharge Instructions (Addendum)
You are being evaluated and treated for your shingles pain  Begin prednisone every morning with food for 5 days to help reduce inflammation and help with pain, stop use of ibuprofen during treatment with steroids  May use gabapentin at bedtime which helps to treat nerve pain  May use Tylenol with codeine every 6 hours as needed for severe pain, be mindful this may make you feel sleepy  May use any topical medicine such as lidocaine, Voltaren gel etc. in an effort to minimize discomfort  May use ice over the affected area in 10 to 15-minute intervals  If your pain continues to persist please follow-up with your primary doctor for reevaluation and further management

## 2023-10-21 NOTE — ED Triage Notes (Addendum)
Patient to Urgent Care with complaints of shingles present to her back. Low grade temps (99.1 max).   Symptoms started one week ago w/ back pain. Reports rash is now present to chest/ right shoulder blade/ right breast. Multiple painful blisters.   Prescribed acyclovir by MD live

## 2023-10-21 NOTE — ED Provider Notes (Signed)
Renaldo Fiddler    CSN: [id number] Arrival date & time: 10/21/23  1152      History   Chief Complaint Chief Complaint  Patient presents with   Rash    HPI Kathryn Powers is a 62 y.o. female.   Patient presents for evaluation of pain related to shingles rash affecting the right upper back, right shoulder, chest and right breast present for 7 days.  Completed a telehealth visit and was started on valacyclovir and ibuprofen 800 mg, denies improvement in symptoms.  Pain is becoming more severe, has been constant.  Cannot tolerate wearing bra.   Past Medical History:  Diagnosis Date   Anemia    Complication of anesthesia    was given Ketamine and had a psychotic outcome   Dysrhythmia    History of kidney stones    Hypertension    pt is not on any meds for HTN   Hypothyroidism     Patient Active Problem List   Diagnosis Date Noted   Chronic pain of left knee 10/27/2018   Adjustment disorder with mixed anxiety and depressed mood 09/11/2017   Increased MCV 09/11/2017   Pure hypercholesterolemia 08/22/2016   Acquired hypothyroidism 04/05/2015   Bilateral hearing loss 04/05/2015   Chronic neck pain 04/05/2015   Vitamin D deficiency 04/05/2015    Past Surgical History:  Procedure Laterality Date   ABDOMINAL HYSTERECTOMY     APPENDECTOMY     CHOLECYSTECTOMY     DILATION AND CURETTAGE OF UTERUS     OPEN REDUCTION INTERNAL FIXATION (ORIF) DISTAL RADIAL FRACTURE Left 08/23/2021   Procedure: LEFT OPEN REDUCTION INTERNAL FIXATION (ORIF) DISTAL RADIAL FRACTURE;  Surgeon: Juanell Fairly, MD;  Location: [facility] OR[facility];  Service: Orthopedics;  Laterality: Left;    OB History   No obstetric history on file.      Home Medications    Prior to Admission medications   Medication Sig Start Date End Date Taking? Authorizing Provider  acetaminophen-codeine (TYLENOL #3) 300-30 MG tablet Take 1-2 tablets by mouth every 6 (six) hours as needed for moderate pain (pain score 4-6).  10/21/23  Yes Chevette Fee, Elita Boone, NP  acyclovir (ZOVIRAX) 800 MG tablet Take 800 mg by mouth 5 (five) times daily. 10/19/23  Yes [provider]  gabapentin (NEURONTIN) 100 MG capsule Take 1 capsule (100 mg total) by mouth at bedtime. 10/21/23  Yes Constanza Mincy R, NP  predniSONE (DELTASONE) 20 MG tablet Take 2 tablets (40 mg total) by mouth daily. 10/21/23  Yes Maxamillian Tienda, Elita Boone, NP  acetaminophen (TYLENOL) 500 MG tablet Take 500 mg by mouth every 6 (six) hours as needed for moderate pain or headache.    [provider]  albuterol (VENTOLIN HFA) 108 (90 Base) MCG/ACT inhaler Inhale 2 puffs into the lungs every 4 (four) hours as needed for wheezing or shortness of breath. 10/02/23   Anihya Tuma, Elita Boone, NP  atenolol (TENORMIN) 50 MG tablet Take 50 mg by mouth See admin instructions. Take 50 mg daily, may take a second 50 mg dose as needed for palpitations 01/30/16   [provider]  benzonatate (TESSALON) 200 MG capsule Take 1 capsule (200 mg total) by mouth 3 (three) times daily as needed. Patient not taking: Reported on 10/21/2023 10/02/23   Valinda Hoar, NP  bismuth subsalicylate (PEPTO BISMOL) 262 MG chewable tablet Chew 262 mg by mouth as needed for indigestion or diarrhea or loose stools.    [provider]  calcium carbonate (TUMS - DOSED IN  MG ELEMENTAL CALCIUM) 500 MG chewable tablet Chew 1 tablet by mouth daily as needed for indigestion or heartburn.    [provider]  cyclobenzaprine (FLEXERIL) 5 MG tablet Take 5-10 mg by mouth daily as needed for muscle spasms. 08/31/19   [provider]  diphenhydrAMINE (BENADRYL) 25 MG tablet Take 25 mg by mouth at bedtime as needed for sleep.    [provider]  famotidine (PEPCID) 20 MG tablet Take 20 mg by mouth daily.    [provider]  ibuprofen (ADVIL) 200 MG tablet Take 400 mg by mouth every 6 (six) hours as needed for moderate pain or headache.    [provider]   levothyroxine (SYNTHROID) 112 MCG tablet Take 112 mcg by mouth daily.    [provider]  Multiple Vitamin (MULTI-VITAMIN) tablet Take 1 tablet by mouth daily.    [provider]  ofloxacin (FLOXIN) 0.3 % OTIC solution Place 10 drops into the right ear daily. 03/26/23   Mickie Bail, NP  ondansetron (ZOFRAN-ODT) 4 MG disintegrating tablet Take 1 tablet (4 mg total) by mouth every 8 (eight) hours as needed. 08/23/21   Juanell Fairly, MD  oxyCODONE (OXY IR/ROXICODONE) 5 MG immediate release tablet Take 1 tablet (5 mg total) by mouth every 4 (four) hours as needed. Patient not taking: Reported on 10/02/2023 08/23/21   Juanell Fairly, MD  PARoxetine (PAXIL) 20 MG tablet Take 20 mg by mouth daily. 01/12/18   [provider]  promethazine-dextromethorphan (PROMETHAZINE-DM) 6.25-15 MG/5ML syrup Take 5 mLs by mouth 4 (four) times daily as needed for cough. Patient not taking: Reported on 10/21/2023 10/02/23   Valinda Hoar, NP  rosuvastatin (CRESTOR) 20 MG tablet Take 20 mg by mouth daily.    [provider]    Family History Family History  Problem Relation Age of Onset   Breast cancer Neg Hx     Social History Social History   Tobacco Use   Smoking status: Never   Smokeless tobacco: Never  Substance Use Topics   Alcohol use: Yes    Alcohol/week: 1.0 standard drink of alcohol    Types: 1 Glasses of wine per week    Comment: occasional   Drug use: No     Allergies   Codeine and Ketamine   Review of Systems Review of Systems  Skin:  Positive for rash.     Physical Exam Triage Vital Signs ED Triage Vitals  Encounter Vitals Group     BP 10/21/23 1204 138/88     Systolic BP Percentile --      Diastolic BP Percentile --      Pulse Rate 10/21/23 1204 77     Resp 10/21/23 1204 18     Temp 10/21/23 1204 99 F (37.2 C)     Temp src --      SpO2 10/21/23 1204 98 %     Weight --      Height --      Head Circumference --      Peak Flow --       Pain Score 10/21/23 1158 9     Pain Loc --      Pain Education --      Exclude from Growth Chart --    No data found.  Updated Vital Signs BP 138/88   Pulse 77   Temp 99 F (37.2 C)   Resp 18   SpO2 98%   Visual Acuity Right Eye Distance:  Left Eye Distance:   Bilateral Distance:    Right Eye Near:   Left Eye Near:    Bilateral Near:     Physical Exam Constitutional:      Appearance: Normal appearance.  Eyes:     Extraocular Movements: Extraocular movements intact.  Pulmonary:     Effort: Pulmonary effort is normal.  Skin:    Comments: Erythematous blistering rash present to the right thoracic region in the right anterior chest overlying the breast  Neurological:     Mental Status: She is alert and oriented to person, place, and time. Mental status is at baseline.      UC Treatments / Results  Labs (all labs ordered are listed, but only abnormal results are displayed) Labs Reviewed - No data to display  EKG   Radiology No results found.  Procedures Procedures (including critical care time)  Medications Ordered in UC Medications - No data to display  Initial Impression / Assessment and Plan / UC Course  I have reviewed the triage vital signs and the nursing notes.  Pertinent labs & imaging results that were available during my care of the patient were reviewed by me and considered in my medical decision making (see chart for details).  Herpes zoster without complication  Presentation is consistent with shingles, recommended finishing course of valacyclovir to reduce virus, prescribed prednisone, Tylenol 3 and gabapentin for management of pain recommended additional topical medicines as needed as well as ice for general comfort if symptoms continue to persist patient to follow-up with her PCP for further management, may discuss Shingrix vaccine once symptoms have resolved Final Clinical Impressions(s) / UC Diagnoses   Final diagnoses:  Herpes  zoster without complication     Discharge Instructions      You are being evaluated and treated for your shingles pain  Begin prednisone every morning with food for 5 days to help reduce inflammation and help with pain, stop use of ibuprofen during treatment with steroids  May use gabapentin at bedtime which helps to treat nerve pain  May use Tylenol with codeine every 6 hours as needed for severe pain, be mindful this may make you feel sleepy  May use any topical medicine such as lidocaine, Voltaren gel etc. in an effort to minimize discomfort  May use ice over the affected area in 10 to 15-minute intervals  If your pain continues to persist please follow-up with your primary doctor for reevaluation and further management   ED Prescriptions     Medication Sig Dispense Auth. Provider   acetaminophen-codeine (TYLENOL #3) 300-30 MG tablet Take 1-2 tablets by mouth every 6 (six) hours as needed for moderate pain (pain score 4-6). 15 tablet Cross Jorge R, NP   predniSONE (DELTASONE) 20 MG tablet Take 2 tablets (40 mg total) by mouth daily. 10 tablet Neng Albee R, NP   gabapentin (NEURONTIN) 100 MG capsule Take 1 capsule (100 mg total) by mouth at bedtime. 10 capsule Valinda Hoar, NP      I have reviewed the PDMP during this encounter.   Valinda Hoar, NP 10/21/23 [phone]

## 2024-06-02 ENCOUNTER — Ambulatory Visit
Admission: EM | Admit: 2024-06-02 | Discharge: 2024-06-02 | Disposition: A | Discharge status: Home / Self Care | Attending: Emergency Medicine | Admitting: Emergency Medicine

## 2024-06-02 ENCOUNTER — Encounter: Payer: Self-pay | Admitting: Emergency Medicine

## 2024-06-02 DIAGNOSIS — R109 Unspecified abdominal pain: Secondary | ICD-10-CM | POA: Diagnosis not present

## 2024-06-02 DIAGNOSIS — R1032 Left lower quadrant pain: Secondary | ICD-10-CM

## 2024-06-02 DIAGNOSIS — Z87442 Personal history of urinary calculi: Secondary | ICD-10-CM

## 2024-06-02 LAB — POCT URINALYSIS DIP (MANUAL ENTRY)
Bilirubin, UA: NEGATIVE
Blood, UA: NEGATIVE
Glucose, UA: NEGATIVE mg/dL
Ketones, POC UA: NEGATIVE mg/dL
Leukocytes, UA: NEGATIVE
Nitrite, UA: NEGATIVE
Protein Ur, POC: NEGATIVE mg/dL
Spec Grav, UA: 1.025 (ref 1.010–1.025)
Urobilinogen, UA: 0.2 U/dL
pH, UA: 6 (ref 5.0–8.0)

## 2024-06-02 MED ORDER — TRAMADOL HCL 50 MG PO TABS: 50.0000 mg | ORAL_TABLET | Freq: Four times a day (QID) | ORAL | 0 refills | Status: AC | PRN

## 2024-06-02 MED ORDER — TAMSULOSIN HCL 0.4 MG PO CAPS: 0.4000 mg | ORAL_CAPSULE | Freq: Every day | ORAL | 0 refills | Status: AC

## 2024-06-02 NOTE — ED Triage Notes (Signed)
 Patient has a hx of kidney stones and states that she is having trouble passing this one. Symptoms x 2 days. Complains of groin pain left side. Rates pain 7/10. Took Tylenol  1000 mg  at 10:00 pm. With no relief.

## 2024-06-02 NOTE — Discharge Instructions (Addendum)
 Follow up with your primary care provider or a urologist tomorrow.  Go to the emergency department if you have persistent or worsening symptoms.    Take the Flomax as directed.    Take the tramadol as directed for pain.  Do not drive, operate machinery, drink alcohol, or perform dangerous activities while taking this medication as it may cause drowsiness.

## 2024-06-02 NOTE — ED Provider Notes (Signed)
 Kathryn Powers    CSN: [id number] Arrival date & time: 06/02/24  1158      History   Chief Complaint Chief Complaint  Patient presents with   Groin Pain    HPI Kathryn Powers is a 63 y.o. female.  Patient presents with left groin pain and flank pain x 2 days.  She attributes her pain to difficulty passing a kidney stone and is having difficulty.  She took Tylenol  last night.  She denies fever, chills, abdominal pain, dysuria, hematuria, vaginal discharge, pelvic pain.  Patient reports history of kidney stones with similar symptoms.  No falls or injury.  The history is provided by the patient and medical records.    Past Medical History:  Diagnosis Date   Anemia    Complication of anesthesia    was given Ketamine  and had a psychotic outcome   Dysrhythmia    History of kidney stones    Hypertension    pt is not on any meds for HTN   Hypothyroidism     Patient Active Problem List   Diagnosis Date Noted   Chronic pain of left knee 10/27/2018   Adjustment disorder with mixed anxiety and depressed mood 09/11/2017   Increased MCV 09/11/2017   Pure hypercholesterolemia 08/22/2016   Acquired hypothyroidism 04/05/2015   Bilateral hearing loss 04/05/2015   Chronic neck pain 04/05/2015   Vitamin D deficiency 04/05/2015    Past Surgical History:  Procedure Laterality Date   ABDOMINAL HYSTERECTOMY     APPENDECTOMY     CHOLECYSTECTOMY     DILATION AND CURETTAGE OF UTERUS     OPEN REDUCTION INTERNAL FIXATION (ORIF) DISTAL RADIAL FRACTURE Left 08/23/2021   Procedure: LEFT OPEN REDUCTION INTERNAL FIXATION (ORIF) DISTAL RADIAL FRACTURE;  Surgeon: Rande Bushy, MD;  Location: [facility] ORS;  Service: Orthopedics;  Laterality: Left;    OB History   No obstetric history on file.      Home Medications    Prior to Admission medications   Medication Sig Start Date End Date Taking? Authorizing Provider  tamsulosin (FLOMAX) 0.4 MG CAPS capsule Take 1 capsule (0.4 mg total)  by mouth daily for 7 days. 06/02/24 06/09/24 Yes Wellington Half, NP  traMADol (ULTRAM) 50 MG tablet Take 1 tablet (50 mg total) by mouth every 6 (six) hours as needed. 06/02/24  Yes Wellington Half, NP  acetaminophen  (TYLENOL ) 500 MG tablet Take 500 mg by mouth every 6 (six) hours as needed for moderate pain or headache.    [provider]  acyclovir (ZOVIRAX) 800 MG tablet Take 800 mg by mouth 5 (five) times daily. 10/19/23   [provider]  albuterol  (VENTOLIN  HFA) 108 (90 Base) MCG/ACT inhaler Inhale 2 puffs into the lungs every 4 (four) hours as needed for wheezing or shortness of breath. 10/02/23   White, Maybelle Spatz, NP  atenolol (TENORMIN) 50 MG tablet Take 50 mg by mouth See admin instructions. Take 50 mg daily, may take a second 50 mg dose as needed for palpitations 01/30/16   [provider]  benzonatate  (TESSALON ) 200 MG capsule Take 1 capsule (200 mg total) by mouth 3 (three) times daily as needed. Patient not taking: Reported on 10/21/2023 10/02/23   Reena Canning, NP  bismuth subsalicylate (PEPTO BISMOL) 262 MG chewable tablet Chew 262 mg by mouth as needed for indigestion or diarrhea or loose stools.    [provider]  calcium carbonate (TUMS - DOSED IN MG ELEMENTAL CALCIUM) 500 MG chewable tablet  Chew 1 tablet by mouth daily as needed for indigestion or heartburn.    [provider]  cyclobenzaprine  (FLEXERIL ) 5 MG tablet Take 5-10 mg by mouth daily as needed for muscle spasms. 08/31/19   [provider]  diphenhydrAMINE (BENADRYL) 25 MG tablet Take 25 mg by mouth at bedtime as needed for sleep.    [provider]  famotidine (PEPCID) 20 MG tablet Take 20 mg by mouth daily.    [provider]  ibuprofen (ADVIL) 200 MG tablet Take 400 mg by mouth every 6 (six) hours as needed for moderate pain or headache.    [provider]  levothyroxine (SYNTHROID) 112 MCG tablet Take 112 mcg by mouth daily.    [provider]  Multiple Vitamin (MULTI-VITAMIN) tablet Take 1 tablet by mouth daily.    [provider]  ofloxacin  (FLOXIN ) 0.3 % OTIC solution Place 10 drops into the right ear daily. 03/26/23   Wellington Half, NP  ondansetron  (ZOFRAN -ODT) 4 MG disintegrating tablet Take 1 tablet (4 mg total) by mouth every 8 (eight) hours as needed. 08/23/21   Krasinski, Kevin, MD  PARoxetine (PAXIL) 20 MG tablet Take 20 mg by mouth daily. 01/12/18   [provider]  predniSONE  (DELTASONE ) 20 MG tablet Take 2 tablets (40 mg total) by mouth daily. 10/21/23   Reena Canning, NP  promethazine -dextromethorphan (PROMETHAZINE -DM) 6.25-15 MG/5ML syrup Take 5 mLs by mouth 4 (four) times daily as needed for cough. Patient not taking: Reported on 10/21/2023 10/02/23   Reena Canning, NP  rosuvastatin (CRESTOR) 20 MG tablet Take 20 mg by mouth daily.    [provider]    Family History Family History  Problem Relation Age of Onset   Breast cancer Neg Hx     Social History Social History   Tobacco Use   Smoking status: Never   Smokeless tobacco: Never  Substance Use Topics   Alcohol use: Yes    Alcohol/week: 1.0 standard drink of alcohol    Types: 1 Glasses of wine per week    Comment: occasional   Drug use: No     Allergies   Codeine  and Ketamine    Review of Systems Review of Systems  Constitutional:  Negative for chills and fever.  Gastrointestinal:  Negative for abdominal pain, blood in stool, constipation, diarrhea and vomiting.  Genitourinary:  Positive for flank pain. Negative for decreased urine volume, dysuria, hematuria, pelvic pain and vaginal discharge.     Physical Exam Triage Vital Signs ED Triage Vitals  Encounter Vitals Group     BP 06/02/24 1210 128/78     Girls Systolic BP Percentile --      Girls Diastolic BP Percentile --      Boys Systolic BP Percentile --      Boys Diastolic BP Percentile --      Pulse Rate 06/02/24 1210 80     Resp 06/02/24  1210 16     Temp 06/02/24 1210 (!) 97.5 F (36.4 C)     Temp src --      SpO2 06/02/24 1210 98 %     Weight --      Height --      Head Circumference --      Peak Flow --      Pain Score 06/02/24 1215 7     Pain Loc --      Pain Education --      Exclude from Growth Chart --  No data found.  Updated Vital Signs BP 128/78   Pulse 80   Temp (!) 97.5 F (36.4 C)   Resp 16   SpO2 98%   Visual Acuity Right Eye Distance:   Left Eye Distance:   Bilateral Distance:    Right Eye Near:   Left Eye Near:    Bilateral Near:     Physical Exam Constitutional:      General: She is not in acute distress. HENT:     Mouth/Throat:     Mouth: Mucous membranes are moist.   Cardiovascular:     Rate and Rhythm: Normal rate and regular rhythm.  Pulmonary:     Effort: Pulmonary effort is normal. No respiratory distress.  Abdominal:     Palpations: Abdomen is soft.     Tenderness: There is no abdominal tenderness. There is no right CVA tenderness, left CVA tenderness, guarding or rebound.     Comments: Patient holding left groin, lower abdomen and states discomfort in this area.  No lymphadenopathy. Abdomen is soft and nontender.  No CVAT.    Skin:    General: Skin is warm and dry.     Findings: No bruising, erythema, lesion or rash.   Neurological:     Mental Status: She is alert.      UC Treatments / Results  Labs (all labs ordered are listed, but only abnormal results are displayed) Labs Reviewed  POCT URINALYSIS DIP (MANUAL ENTRY) - Normal    EKG   Radiology No results found.  Procedures Procedures (including critical care time)  Medications Ordered in UC Medications - No data to display  Initial Impression / Assessment and Plan / UC Course  I have reviewed the triage vital signs and the nursing notes.  Pertinent labs & imaging results that were available during my care of the patient were reviewed by me and considered in my medical decision making (see  chart for details).    Left groin pain, left flank pain, history of kidney stones.  Afebrile and vital signs are stable.  Patient declines transfer to the ED.  Urine normal.  Patient reports similar symptoms when she has had kidney stones in the past.  Treating today with Flomax.  Instructed her to take ibuprofen or Tylenol  for her discomfort.  Tramadol prescribed for pain uncontrolled with OTC medication.  Precautions for drowsiness with tramadol discussed.  Instructed her to go to the emergency department if she has persistent or worsening symptoms.  Instructed her to follow-up with her PCP or a urologist tomorrow.  Contact information for on-call urologist provided.  Education provided on flank pain and kidney stones.  She agrees to plan of care.  Final Clinical Impressions(s) / UC Diagnoses   Final diagnoses:  Left groin pain  Left flank pain  History of kidney stones     Discharge Instructions      Follow up with your primary care provider or a urologist tomorrow.  Go to the emergency department if you have persistent or worsening symptoms.    Take the Flomax as directed.    Take the tramadol as directed for pain.  Do not drive, operate machinery, drink alcohol, or perform dangerous activities while taking this medication as it may cause drowsiness.      ED Prescriptions     Medication Sig Dispense Auth. Provider   tamsulosin (FLOMAX) 0.4 MG CAPS capsule Take 1 capsule (0.4 mg total) by mouth daily for 7 days. 7 capsule Wellington Half, NP  traMADol (ULTRAM) 50 MG tablet Take 1 tablet (50 mg total) by mouth every 6 (six) hours as needed. 15 tablet Wellington Half, NP      I have reviewed the PDMP during this encounter.   Wellington Half, NP 06/02/24 [phone]

## 2024-07-05 ENCOUNTER — Ambulatory Visit (INDEPENDENT_AMBULATORY_CARE_PROVIDER_SITE_OTHER): Admitting: Urology

## 2024-07-05 ENCOUNTER — Encounter: Payer: Self-pay | Admitting: Urology

## 2024-07-05 VITALS — BP 129/88 | HR 73 | Ht 59.0 in | Wt 128.0 lb

## 2024-07-05 DIAGNOSIS — N200 Calculus of kidney: Secondary | ICD-10-CM

## 2024-07-05 DIAGNOSIS — R109 Unspecified abdominal pain: Secondary | ICD-10-CM | POA: Diagnosis not present

## 2024-07-05 LAB — URINALYSIS, COMPLETE
Bilirubin, UA: NEGATIVE
Glucose, UA: NEGATIVE
Ketones, UA: NEGATIVE
Leukocytes,UA: NEGATIVE
Nitrite, UA: NEGATIVE
Protein,UA: NEGATIVE
RBC, UA: NEGATIVE
Specific Gravity, UA: 1.02 (ref 1.005–1.030)
Urobilinogen, Ur: 0.2 mg/dL (ref 0.2–1.0)
pH, UA: 6 (ref 5.0–7.5)

## 2024-07-05 LAB — MICROSCOPIC EXAMINATION
Mucus, UA: NONE SEEN
RBC, Urine: NONE SEEN /HPF (ref 0–2)

## 2024-07-05 LAB — BLADDER SCAN AMB NON-IMAGING

## 2024-07-05 MED ORDER — TAMSULOSIN HCL 0.4 MG PO CAPS: 0.4000 mg | ORAL_CAPSULE | Freq: Every day | ORAL | 0 refills | Status: AC

## 2024-07-05 NOTE — Patient Instructions (Signed)
 Scheduling [phone]

## 2024-07-05 NOTE — Progress Notes (Addendum)
 I, Kathryn Powers, acting as a scribe for Kathryn JAYSON Barba, MD., have documented all relevant documentation on the behalf of Kathryn JAYSON Barba, MD, as directed by Kathryn JAYSON Barba, MD while in the presence of Kathryn JAYSON Barba, MD.  07/05/2024 3:04 PM   Kathryn Powers Jan 03, 1961 [id number]  Referring provider: Fernande Ophelia JINNY DOUGLAS, MD [address] [facility] [place],  [place] [address]  Chief Complaint  Patient presents with   New Patient (Initial Visit)    HPI: Kathryn Powers is a 63 y.o. female referred for kidney stones.  Seen [place] urgent care 06/02/24 with a 2 day history of left flank pain radiating to the left groin region. Associated with urinary hesitancy and sensation of incomplete bladder emptying. Dipstick urinalysis was unremarkable. She was started on tamsulosin  for suspected urinal calculus and advised to follow up with urology or her PCP.  Saw Dr. Fernande 06/03/24 and a KUB was ordered which showed multiple pelvic phleboliths and no definite calcifications suspicious for a urinary tract stone.  Has run out of tamsulosin  and feels symptoms have worsened.  Primary complaints of urinary hesitancy, decreased stream, frequency, and urgency.  Prior history of stone in the past which she passed.   PMH: Past Medical History:  Diagnosis Date   Anemia    Complication of anesthesia    was given Ketamine  and had a psychotic outcome   Dysrhythmia    History of kidney stones    Hypertension    pt is not on any meds for HTN   Hypothyroidism     Surgical History: Past Surgical History:  Procedure Laterality Date   ABDOMINAL HYSTERECTOMY     APPENDECTOMY     CHOLECYSTECTOMY     DILATION AND CURETTAGE OF UTERUS     OPEN REDUCTION INTERNAL FIXATION (ORIF) DISTAL RADIAL FRACTURE Left 08/23/2021   Procedure: LEFT OPEN REDUCTION INTERNAL FIXATION (ORIF) DISTAL RADIAL FRACTURE;  Surgeon: Marchia Drivers, MD;  Location: [facility] ORS;  Service: Orthopedics;   Laterality: Left;    Home Medications:  Allergies as of 07/05/2024       Reactions   Codeine  Nausea And Vomiting   Ketamine     Freaked her out         Medication List        Accurate as of July 05, 2024  3:04 PM. If you have any questions, ask your nurse or doctor.          acetaminophen  500 MG tablet Commonly known as: TYLENOL  Take 500 mg by mouth every 6 (six) hours as needed for moderate pain or headache.   acyclovir 800 MG tablet Commonly known as: ZOVIRAX Take 800 mg by mouth 5 (five) times daily.   albuterol  108 (90 Base) MCG/ACT inhaler Commonly known as: VENTOLIN  HFA Inhale 2 puffs into the lungs every 4 (four) hours as needed for wheezing or shortness of breath.   atenolol 50 MG tablet Commonly known as: TENORMIN Take 50 mg by mouth See admin instructions. Take 50 mg daily, may take a second 50 mg dose as needed for palpitations   benzonatate  200 MG capsule Commonly known as: TESSALON  Take 1 capsule (200 mg total) by mouth 3 (three) times daily as needed.   bismuth subsalicylate 262 MG chewable tablet Commonly known as: PEPTO BISMOL Chew 262 mg by mouth as needed for indigestion or diarrhea or loose stools.   calcium carbonate 500 MG chewable tablet Commonly known as: TUMS - dosed in mg  elemental calcium Chew 1 tablet by mouth daily as needed for indigestion or heartburn.   cyclobenzaprine  5 MG tablet Commonly known as: FLEXERIL  Take 5-10 mg by mouth daily as needed for muscle spasms.   diphenhydrAMINE 25 MG tablet Commonly known as: BENADRYL Take 25 mg by mouth at bedtime as needed for sleep.   famotidine 20 MG tablet Commonly known as: PEPCID Take 20 mg by mouth daily.   ibuprofen 200 MG tablet Commonly known as: ADVIL Take 400 mg by mouth every 6 (six) hours as needed for moderate pain or headache.   levothyroxine 112 MCG tablet Commonly known as: SYNTHROID Take 112 mcg by mouth daily.   Multi-Vitamin tablet Take 1 tablet by mouth  daily.   ofloxacin  0.3 % OTIC solution Commonly known as: FLOXIN  Place 10 drops into the right ear daily.   ondansetron  4 MG disintegrating tablet Commonly known as: ZOFRAN -ODT Take 1 tablet (4 mg total) by mouth every 8 (eight) hours as needed.   PARoxetine 20 MG tablet Commonly known as: PAXIL Take 20 mg by mouth daily.   predniSONE  20 MG tablet Commonly known as: DELTASONE  Take 2 tablets (40 mg total) by mouth daily.   promethazine -dextromethorphan 6.25-15 MG/5ML syrup Commonly known as: PROMETHAZINE -DM Take 5 mLs by mouth 4 (four) times daily as needed for cough.   rosuvastatin 20 MG tablet Commonly known as: CRESTOR Take 20 mg by mouth daily.   tamsulosin  0.4 MG Caps capsule Commonly known as: FLOMAX  Take 1 capsule (0.4 mg total) by mouth daily.   traMADol  50 MG tablet Commonly known as: ULTRAM  Take 1 tablet (50 mg total) by mouth every 6 (six) hours as needed.        Allergies:  Allergies  Allergen Reactions   Codeine  Nausea And Vomiting   Ketamine      Freaked her out     Family History: Family History  Problem Relation Age of Onset   Breast cancer Neg Hx     Social History:  reports that she has never smoked. She has never used smokeless tobacco. She reports current alcohol use of about 1.0 standard drink of alcohol per week. She reports that she does not use drugs.   Physical Exam: BP 129/88   Pulse 73   Ht 4' 11 (1.499 m)   Wt 128 lb (58.1 kg)   BMI 25.85 kg/m   Constitutional:  Alert and oriented, No acute distress. HEENT: [place] AT Respiratory: Normal respiratory effort, no increased work of breathing. Psychiatric: Normal mood and affect.   Urinalysis Dipstick/microscopy negative   Assessment & Plan:    Right flank pain 63 year old female with 1 month history of right flank/inguinal pain associated with urinary hesitancy, sensation of incomplete emptying, and frequency/urgency.  PVR today 16 mL CT renal stone study ordered and will  be notified with results.  Restart tamsulosin  0.4 mg daily.  I have reviewed the above documentation for accuracy and completeness, and I agree with the above.   Kathryn JAYSON Barba, MD  [facility] [address], [address] [place], [place] [address] [phone]

## 2024-07-07 ENCOUNTER — Encounter: Payer: Self-pay | Admitting: Urology

## 2024-07-12 ENCOUNTER — Ambulatory Visit

## 2024-07-14 ENCOUNTER — Ambulatory Visit
Admission: RE | Admit: 2024-07-14 | Discharge: 2024-07-14 | Disposition: A | Discharge status: Home / Self Care | Source: Ambulatory Visit | Attending: Urology | Admitting: Urology

## 2024-07-14 DIAGNOSIS — R109 Unspecified abdominal pain: Secondary | ICD-10-CM | POA: Diagnosis present

## 2024-07-18 ENCOUNTER — Ambulatory Visit: Payer: Self-pay | Admitting: Urology

## 2024-07-27 ENCOUNTER — Other Ambulatory Visit: Payer: Self-pay | Admitting: Urology

## 2024-11-19 ENCOUNTER — Ambulatory Visit: Payer: Self-pay

## 2024-11-19 DIAGNOSIS — K449 Diaphragmatic hernia without obstruction or gangrene: Secondary | ICD-10-CM | POA: Diagnosis not present

## 2024-11-19 DIAGNOSIS — K219 Gastro-esophageal reflux disease without esophagitis: Secondary | ICD-10-CM | POA: Diagnosis not present

## 2024-11-19 DIAGNOSIS — R1319 Other dysphagia: Secondary | ICD-10-CM | POA: Diagnosis present

## 2024-11-19 DIAGNOSIS — K222 Esophageal obstruction: Secondary | ICD-10-CM | POA: Diagnosis not present
